# Patient Record
Sex: Male | Born: 1995 | Race: White | Hispanic: No | Marital: Single | Smoking: Never smoker
Health system: Southern US, Community
[De-identification: ages and names within clinical notes are randomized; demographics above are authoritative.]

## PROBLEM LIST (undated history)

## (undated) DIAGNOSIS — Z8249 Family history of ischemic heart disease and other diseases of the circulatory system: Secondary | ICD-10-CM

## (undated) DIAGNOSIS — E663 Overweight: Secondary | ICD-10-CM

## (undated) DIAGNOSIS — Z833 Family history of diabetes mellitus: Secondary | ICD-10-CM

## (undated) HISTORY — DX: Family history of ischemic heart disease and other diseases of the circulatory system: Z82.49

## (undated) HISTORY — DX: Overweight: E66.3

## (undated) HISTORY — DX: Family history of diabetes mellitus: Z83.3

## (undated) HISTORY — PX: TONSILLECTOMY AND ADENOIDECTOMY: SUR1326

## (undated) HISTORY — PX: TYMPANOSTOMY TUBE PLACEMENT: SHX32

---
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
## 2016-06-07 ENCOUNTER — Encounter: Payer: Self-pay | Admitting: Family Medicine

## 2016-06-07 ENCOUNTER — Ambulatory Visit (INDEPENDENT_AMBULATORY_CARE_PROVIDER_SITE_OTHER): Admitting: Family Medicine

## 2016-06-07 VITALS — BP 131/72 | HR 72 | Ht 76.0 in | Wt 221.0 lb

## 2016-06-07 DIAGNOSIS — Z833 Family history of diabetes mellitus: Secondary | ICD-10-CM

## 2016-06-07 DIAGNOSIS — Z8249 Family history of ischemic heart disease and other diseases of the circulatory system: Secondary | ICD-10-CM

## 2016-06-07 DIAGNOSIS — E663 Overweight: Secondary | ICD-10-CM | POA: Diagnosis not present

## 2016-06-07 HISTORY — DX: Family history of ischemic heart disease and other diseases of the circulatory system: Z82.49

## 2016-06-07 HISTORY — DX: Overweight: E66.3

## 2016-06-07 HISTORY — DX: Family history of diabetes mellitus: Z83.3

## 2016-06-07 NOTE — Progress Notes (Signed)
New patient office visit note:  Impression and Recommendations:    1. Family history of early CAD- father died AMI age 21   2. Overweight (BMI 25.0-29.9)   3. Family history of essential hypertension   4. Family history of diabetes mellitus (DM)    -Encourage prudent diet of Mediterranean diet -Recommend 3040 minutes of continuous moderate intensity aerobic activity more than 5 days a week. - Weight loss encouraged. -Recommend to patient that he is pre-hypertensive and normal blood pressures 119/79 or less according to the AHA -Recommend fasting yearly blood work in the future at follow-up.  Patient's Medications   No medications on file    Return in about 3 months (around 09/07/2016) for Fasting blood work in near future.  The patient was counseled, risk factors were discussed, anticipatory guidance given.  Gross side effects, risk and benefits, and alternatives of medications discussed with patient.  Patient is aware that all medications have potential side effects and we are unable to predict every side effect or drug-drug interaction that may occur.  Expresses verbal understanding and consents to current therapy plan and treatment regimen.  Please see AVS handed out to patient at the end of our visit for further patient instructions/ counseling done pertaining to today's office visit.    Note: This document was prepared using Dragon voice recognition software and may include unintentional dictation errors.  ----------------------------------------------------------------------------------------------------------------------    Subjective:    Chief Complaint  Patient presents with  . Establish Care    HPI: Cristian Kennedy is a pleasant 20 y.o. male who [facility] to review their medical history with me and establish care.   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  His father died 11/11/2011 at age 21 of a heart attack. He also had a stroke in his late 9740s. He was a drinker and smoker.  Patient not sexually active currently. Has had male partners in past but not now.  Never smoked, doesn't drink.  Patient reports satisfied with his weight and has a fair diet.     Wt Readings from Last 3 Encounters:  06/07/16 221 lb (100.2 kg)   BP Readings from Last 3 Encounters:  06/07/16 131/72   Pulse Readings from Last 3 Encounters:  06/07/16 72     Patient Active Problem List   Diagnosis Date Noted  . Family history of early CAD- father died AMI age 21 06/07/2016  . Overweight (BMI 25.0-29.9) 06/07/2016  . Family history of essential hypertension 06/07/2016  . Family history of diabetes mellitus (DM) 06/07/2016     Past Medical History:  Diagnosis Date  . Family history of diabetes mellitus (DM) 06/07/2016  . Family history of early CAD- father died AMI age 10250 06/07/2016  . Family history of essential hypertension 06/07/2016  . Overweight (BMI 25.0-29.9) 06/07/2016     Past Surgical History:  Procedure Laterality Date  . TONSILLECTOMY AND ADENOIDECTOMY    . TYMPANOSTOMY TUBE PLACEMENT       Family History  Problem Relation Age of Onset  . Cancer Mother     leukemia  . Hypertension Mother   . Alcohol abuse Father   .  Stroke Father   . Alcohol abuse Paternal Uncle   . Diabetes Maternal Grandmother   . Hypertension Maternal Grandmother   . Cancer Paternal Grandmother     stomach/liver  . Diabetes Paternal Grandmother   . Hypertension Paternal Grandmother   . Alcohol abuse Paternal Grandfather      History  Drug Use No    History  Alcohol Use No    History  Smoking Status  . Never Smoker  Smokeless Tobacco  . Never Used     Patient's Medications   No medications on file    Allergies: Review of patient's allergies indicates no known allergies.  Review of Systems:   ( Completed via Adult Medical History Intake form today ) General:  Denies fever, chills, appetite changes, unexplained weight loss.  Optho/Auditory:   Denies visual changes, blurred vision/LOV, ringing in ears/ diff hearing Respiratory:   Denies SOB, DOE, cough, wheezing.  Cardiovascular:   Denies chest pain, palpitations, new onset peripheral edema  Gastrointestinal:   Denies nausea, vomiting, diarrhea.  Genitourinary:    Denies dysuria, increased frequency, flank pain.  Endocrine:     Denies hot or cold intolerance, polyuria, polydipsia. Musculoskeletal:  Denies unexplained myalgias, joint swelling, arthralgias, gait problems.  Skin:  Denies rash, suspicious lesions or new/ changes in moles Neurological:    Denies dizziness, syncope, unexplained weakness, lightheadedness, numbness  Psychiatric/Behavioral:   Denies mood changes, suicidal or homicidal ideations, hallucinations    Objective:   There were no vitals taken for this visit. Body mass index is 26.9 kg/m.   General: Well Developed, well nourished, and in no acute distress.  Neuro: Alert and oriented x3, extra-ocular muscles intact, sensation grossly intact.  HEENT: Normocephalic, atraumatic, pupils equal round reactive to light, neck supple, no gross masses, no carotid bruits, no JVD apprec Skin: no gross suspicious lesions or rashes  Cardiac: Regular rate and rhythm, no murmurs rubs or gallops.  Respiratory: Essentially clear to auscultation bilaterally. Not using accessory muscles, speaking in full sentences.  Abdominal: Soft, not grossly distended Musculoskeletal: Ambulates w/o diff, FROM * 4 ext.  Vasc: less 2 sec cap RF, warm and pink  Psych:  No HI/SI, judgement and insight good.

## 2016-06-07 NOTE — Patient Instructions (Signed)
     Mediterranean Diet  Why follow it? Research shows. . Those who follow the Mediterranean diet have a reduced risk of heart disease  . The diet is associated with a reduced incidence of Parkinson's and Alzheimer's diseases . People following the diet may have longer life expectancies and lower rates of chronic diseases  . The Dietary Guidelines for Americans recommends the Mediterranean diet as an eating plan to promote health and prevent disease  What Is the Mediterranean Diet?  . Healthy eating plan based on typical foods and recipes of Mediterranean-style cooking . The diet is primarily a plant based diet; these foods should make up a majority of meals   Starches - Plant based foods should make up a majority of meals - They are an important sources of vitamins, minerals, energy, antioxidants, and fiber - Choose whole grains, foods high in fiber and minimally processed items  - Typical grain sources include wheat, oats, barley, corn, brown rice, bulgar, farro, millet, polenta, couscous  - Various types of beans include chickpeas, lentils, fava beans, black beans, white beans   Fruits  Veggies - Large quantities of antioxidant rich fruits & veggies; 6 or more servings  - Vegetables can be eaten raw or lightly drizzled with oil and cooked  - Vegetables common to the traditional Mediterranean Diet include: artichokes, arugula, beets, broccoli, brussel sprouts, cabbage, carrots, celery, collard greens, cucumbers, eggplant, kale, leeks, lemons, lettuce, mushrooms, okra, onions, peas, peppers, potatoes, pumpkin, radishes, rutabaga, shallots, spinach, sweet potatoes, turnips, zucchini - Fruits common to the Mediterranean Diet include: apples, apricots, avocados, cherries, clementines, dates, figs, grapefruits, grapes, melons, nectarines, oranges, peaches, pears, pomegranates, strawberries, tangerines  Fats - Replace butter and margarine with healthy oils, such as olive oil, canola oil, and  tahini  - Limit nuts to no more than a handful a day  - Nuts include walnuts, almonds, pecans, pistachios, pine nuts  - Limit or avoid candied, honey roasted or heavily salted nuts - Olives are central to the Mediterranean diet - can be eaten whole or used in a variety of dishes   Meats Protein - Limiting red meat: no more than a few times a month - When eating red meat: choose lean cuts and keep the portion to the size of deck of cards - Eggs: approx. 0 to 4 times a week  - Fish and lean poultry: at least 2 a week  - Healthy protein sources include, chicken, turkey, lean beef, lamb - Increase intake of seafood such as tuna, salmon, trout, mackerel, shrimp, scallops - Avoid or limit high fat processed meats such as sausage and bacon  Dairy - Include moderate amounts of low fat dairy products  - Focus on healthy dairy such as fat free yogurt, skim milk, low or reduced fat cheese - Limit dairy products higher in fat such as whole or 2% milk, cheese, ice cream  Alcohol - Moderate amounts of red wine is ok  - No more than 5 oz daily for women (all ages) and men older than age 65  - No more than 10 oz of wine daily for men younger than 65  Other - Limit sweets and other desserts  - Use herbs and spices instead of salt to flavor foods  - Herbs and spices common to the traditional Mediterranean Diet include: basil, bay leaves, chives, cloves, cumin, fennel, garlic, lavender, marjoram, mint, oregano, parsley, pepper, rosemary, sage, savory, sumac, tarragon, thyme   It's not just a diet, it's   a lifestyle:  . The Mediterranean diet includes lifestyle factors typical of those in the region  . Foods, drinks and meals are best eaten with others and savored . Daily physical activity is important for overall good health . This could be strenuous exercise like running and aerobics . This could also be more leisurely activities such as walking, housework, yard-work, or taking the stairs . Moderation is  the key; a balanced and healthy diet accommodates most foods and drinks . Consider portion sizes and frequency of consumption of certain foods   Meal Ideas & Options:  . Breakfast:  o Whole wheat toast or whole wheat English muffins with peanut butter & hard boiled egg o Steel cut oats topped with apples & cinnamon and skim milk  o Fresh fruit: banana, strawberries, melon, berries, peaches  o Smoothies: strawberries, bananas, greek yogurt, peanut butter o Low fat greek yogurt with blueberries and granola  o Egg white omelet with spinach and mushrooms o Breakfast couscous: whole wheat couscous, apricots, skim milk, cranberries  . Sandwiches:  o Hummus and grilled vegetables (peppers, zucchini, squash) on whole wheat bread   o Grilled chicken on whole wheat pita with lettuce, tomatoes, cucumbers or tzatziki  o Tuna salad on whole wheat bread: tuna salad made with greek yogurt, olives, red peppers, capers, green onions o Garlic rosemary lamb pita: lamb sauted with garlic, rosemary, salt & pepper; add lettuce, cucumber, greek yogurt to pita - flavor with lemon juice and black pepper  . Seafood:  o Mediterranean grilled salmon, seasoned with garlic, basil, parsley, lemon juice and black pepper o Shrimp, lemon, and spinach whole-grain pasta salad made with low fat greek yogurt  o Seared scallops with lemon orzo  o Seared tuna steaks seasoned salt, pepper, coriander topped with tomato mixture of olives, tomatoes, olive oil, minced garlic, parsley, green onions and cappers  . Meats:  o Herbed greek chicken salad with kalamata olives, cucumber, feta  o Red bell peppers stuffed with spinach, bulgur, lean ground beef (or lentils) & topped with feta   o Kebabs: skewers of chicken, tomatoes, onions, zucchini, squash  o Turkey burgers: made with red onions, mint, dill, lemon juice, feta cheese topped with roasted red peppers . Vegetarian o Cucumber salad: cucumbers, artichoke hearts, celery, red  onion, feta cheese, tossed in olive oil & lemon juice  o Hummus and whole grain pita points with a greek salad (lettuce, tomato, feta, olives, cucumbers, red onion) o Lentil soup with celery, carrots made with vegetable broth, garlic, salt and pepper  o Tabouli salad: parsley, bulgur, mint, scallions, cucumbers, tomato, radishes, lemon juice, olive oil, salt and pepper.  

## 2016-06-09 ENCOUNTER — Other Ambulatory Visit (INDEPENDENT_AMBULATORY_CARE_PROVIDER_SITE_OTHER)

## 2016-06-09 DIAGNOSIS — E663 Overweight: Secondary | ICD-10-CM

## 2016-06-09 DIAGNOSIS — Z8249 Family history of ischemic heart disease and other diseases of the circulatory system: Secondary | ICD-10-CM | POA: Diagnosis not present

## 2016-06-09 DIAGNOSIS — Z833 Family history of diabetes mellitus: Secondary | ICD-10-CM

## 2016-06-09 LAB — POCT GLYCOSYLATED HEMOGLOBIN (HGB A1C): Hemoglobin A1C: >14

## 2016-06-10 LAB — COMPREHENSIVE METABOLIC PANEL
ALK PHOS: 112 U/L (ref 40–115)
ALT: 23 U/L (ref 9–46)
AST: 20 U/L (ref 10–40)
Albumin: 4.2 g/dL (ref 3.6–5.1)
BUN: 13 mg/dL (ref 7–25)
CALCIUM: 9.7 mg/dL (ref 8.6–10.3)
CHLORIDE: 97 mmol/L — AB (ref 98–110)
CO2: 29 mmol/L (ref 20–31)
Creat: 1.06 mg/dL (ref 0.60–1.35)
GLUCOSE: 385 mg/dL — AB (ref 65–99)
POTASSIUM: 4.9 mmol/L (ref 3.5–5.3)
Sodium: 138 mmol/L (ref 135–146)
Total Bilirubin: 0.6 mg/dL (ref 0.2–1.2)
Total Protein: 7.4 g/dL (ref 6.1–8.1)

## 2016-06-10 LAB — CBC WITH DIFFERENTIAL/PLATELET
BASOS ABS: 0 {cells}/uL (ref 0–200)
BASOS PCT: 0 %
EOS ABS: 112 {cells}/uL (ref 15–500)
Eosinophils Relative: 2 %
HEMATOCRIT: 46.8 % (ref 38.5–50.0)
Hemoglobin: 16 g/dL (ref 13.2–17.1)
LYMPHS PCT: 44 %
Lymphs Abs: 2464 cells/uL (ref 850–3900)
MCH: 28.9 pg (ref 27.0–33.0)
MCHC: 34.2 g/dL (ref 32.0–36.0)
MCV: 84.5 fL (ref 80.0–100.0)
MONO ABS: 392 {cells}/uL (ref 200–950)
MONOS PCT: 7 %
MPV: 12.4 fL (ref 7.5–12.5)
NEUTROS PCT: 47 %
Neutro Abs: 2632 cells/uL (ref 1500–7800)
PLATELETS: 204 10*3/uL (ref 140–400)
RBC: 5.54 MIL/uL (ref 4.20–5.80)
RDW: 12.8 % (ref 11.0–15.0)
WBC: 5.6 10*3/uL (ref 3.8–10.8)

## 2016-06-10 LAB — LIPID PANEL
CHOL/HDL RATIO: 5.6 ratio — AB (ref <=5.0)
CHOLESTEROL: 229 mg/dL — AB (ref 125–200)
HDL: 41 mg/dL (ref >=40)
Triglycerides: 543 mg/dL — ABNORMAL HIGH (ref <150)

## 2016-06-10 LAB — TSH: TSH: 8.29 mIU/L — ABNORMAL HIGH (ref 0.40–4.50)

## 2016-06-10 LAB — VITAMIN D 25 HYDROXY (VIT D DEFICIENCY, FRACTURES): Vit D, 25-Hydroxy: 21 ng/mL — ABNORMAL LOW (ref 30–100)

## 2016-06-12 NOTE — Progress Notes (Signed)
  I will discuss the results of these tests with the patient at our upcoming planned follow-up office visit.    (However Cristian Kennedy, if patient is a no-show, please send them a letter with these results and ask pt to please contact our office for a follow-up office visit so we can discuss them and determine what further action is needed.  Thank you!)

## 2016-06-13 ENCOUNTER — Ambulatory Visit (INDEPENDENT_AMBULATORY_CARE_PROVIDER_SITE_OTHER): Admitting: Family Medicine

## 2016-06-13 ENCOUNTER — Encounter: Payer: Self-pay | Admitting: Family Medicine

## 2016-06-13 VITALS — BP 108/67 | HR 64 | Wt 225.0 lb

## 2016-06-13 DIAGNOSIS — Z8249 Family history of ischemic heart disease and other diseases of the circulatory system: Secondary | ICD-10-CM

## 2016-06-13 DIAGNOSIS — E1010 Type 1 diabetes mellitus with ketoacidosis without coma: Secondary | ICD-10-CM | POA: Diagnosis not present

## 2016-06-13 DIAGNOSIS — E559 Vitamin D deficiency, unspecified: Secondary | ICD-10-CM | POA: Diagnosis not present

## 2016-06-13 DIAGNOSIS — E781 Pure hyperglyceridemia: Secondary | ICD-10-CM

## 2016-06-13 DIAGNOSIS — E1069 Type 1 diabetes mellitus with other specified complication: Secondary | ICD-10-CM | POA: Insufficient documentation

## 2016-06-13 DIAGNOSIS — E663 Overweight: Secondary | ICD-10-CM

## 2016-06-13 DIAGNOSIS — R946 Abnormal results of thyroid function studies: Secondary | ICD-10-CM | POA: Diagnosis not present

## 2016-06-13 DIAGNOSIS — E109 Type 1 diabetes mellitus without complications: Secondary | ICD-10-CM | POA: Insufficient documentation

## 2016-06-13 DIAGNOSIS — R7989 Other specified abnormal findings of blood chemistry: Secondary | ICD-10-CM

## 2016-06-13 MED ORDER — NIACIN ER (ANTIHYPERLIPIDEMIC) 1000 MG PO TBCR: 1000.0000 mg | EXTENDED_RELEASE_TABLET | Freq: Every day | ORAL | 1 refills | Status: DC

## 2016-06-13 MED ORDER — INSULIN GLARGINE 100 UNIT/ML SOLOSTAR PEN: 20.0000 [IU] | PEN_INJECTOR | Freq: Every day | SUBCUTANEOUS | 11 refills | Status: DC

## 2016-06-13 NOTE — Progress Notes (Signed)
Assessment and plan:  1. Uncontrolled type 1 diabetes mellitus with ketoacidosis without coma ([place])   2. Hypertriglyceridemia, familial   3. Elevated TSH   4. Vitamin D insufficiency   5. Family history of early CAD- father died AMI age 21   6. Overweight (BMI 25.0-29.9)     Vitamin D insufficiency Supplementation discussed with patient of 5000 IUs D3 daily including a multivitamin.  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  - Extensive counseling on diabetes type 1 done today--> Patient is in absolute shock today by this news and is tearful/ devastated. Pt was in the office today for 40+ minutes, with over 50% time spent in face to face counseling of various medical concerns and in coordination of care  - Blood glucose monitoring discussed with patient extensively and handouts provided  - Lantus at low dose starting at night- counseling done.   Told patient to please ask pharmacist to show him exactly how to use the prefilled syringes.   - I discussed he needs to sign up for my chart so that we can have close contact- and he can ask me any questions he may have until he gets in with the endocrinologist down in [place].   Referral placed today.  -  patient to follow-up with me within a couple days, since he will be going to [place] to go back to college in just 2-3 days  Hypertriglyceridemia, familial - Extensive counseling done. We will start B vitamin on patient today so that he doesn't feel so overwhelmed with all these different medications.  - Low saturated and  Transfats diet discussed.   - Personal handwritten educational notes on his lab results sheets provided to patient to include AHA exercise guidelines and dietary modifications for hypertriglyceridemia and DM.      Family history of early CAD- father died AMI age 98 Patient does not know if he had diabetes are not as his father really never went to the doctors.  Overweight (BMI 25.0-29.9) Exercising and dietary counseling done.  Elevated TSH Discussed with patient the clinical significance of this.   I explained I would like to obtain further blood work from him yet we both determined he will follow-up with endocrinologist in [place] in regards to this since patient is completely asymptomatic and feeling overwhelmed as is with having to go on treatment for diabetes.   Patient's Medications  New Prescriptions   INSULIN GLARGINE (LANTUS SOLOSTAR) 100 UNIT/ML SOLOSTAR PEN    Inject 20 Units into the skin at bedtime. Please include QS 3 months.   NIACIN (NIASPAN) 1000 MG CR TABLET    Take 1 tablet (1,000 mg total) by mouth at bedtime.  Previous Medications   No medications on file  Modified Medications   No medications on file  Discontinued Medications   No medications on file    Return in about 3 days (around 06/16/2016) for Follow-up of current medical issues.  Anticipatory guidance and routine counseling done re: condition, txmnt options and need for follow up. All questions of patient's were answered.   Gross side effects, risk and benefits, and  alternatives of medications discussed with patient.  Patient is aware that all medications have potential side effects and we are unable to predict every sideeffect or drug-drug interaction that may occur.  Expresses verbal understanding and consents to current therapy plan and treatment regiment.  Please see AVS handed out to patient at the end of our visit for additional patient instructions/ counseling done pertaining to today's office  visit.  Note: This document was prepared using Dragon voice recognition software and may include unintentional dictation errors.   ----------------------------------------------------------------------------------------------------------------------  Subjective:   CC: Cristian Kennedy is a 20 y.o. male who presents to [place] at [facility] today for review of his recent fasting labs which are consistent with:  off the chart type 1 diabetes, extreme hyper triglyceride levels and elevated TSH, all which suggest autoimmune disorder   A lot DM in his family - about 75% of those in his family are diabetic. Patient doesn't know many type ones though.   Patient is completely asymptomatic today, and is incomplete shock of this news as am I - He is very tearful, frightened and overwhelmed.    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       Drinks a lot of soda and gatorade. Ever since middle school - he recalls always being thirsty but yet he "always drink a lot".   He denies any    Wt Readings from Last 3 Encounters:  06/16/16 222 lb 1.6 oz (100.7 kg)  06/13/16 225 lb (102.1 kg)  06/15/2016 221 lb (100.2 kg)   BP Readings from Last 3 Encounters:  06/16/16 115/75  06/13/16 108/67  06-15-16 131/72   Pulse Readings from Last 3 Encounters:  06/16/16 68  06/13/16 64  06/15/16 72      Full medical history updated and reviewed in the office today  Patient Active Problem List   Diagnosis Date Noted  . Type 1 diabetes mellitus with ketoacidosis, uncontrolled ([place]) 06/13/2016    Priority: High  . Hypertriglyceridemia, familial 06/13/2016    Priority: High  . Family history of early CAD- father died AMI age 27 06/15/16    Priority: High  . Elevated TSH 06/25/2016    Priority: Medium  . Overweight (BMI 25.0-29.9)  06/15/2016    Priority: Medium  . Vitamin D insufficiency 06/25/2016    Priority: Low  . Family history of essential hypertension 06/15/2016  . Family history of diabetes mellitus (DM) 06/15/2016    Past Medical History:  Diagnosis Date  . Family history of diabetes mellitus (DM) 15-Jun-2016  . Family history of early CAD- father died AMI age 41 2016/06/15  . Family history of essential hypertension 06-15-2016  . Overweight (BMI 25.0-29.9) Jun 15, 2016    Past Surgical History:  Procedure Laterality Date  . TONSILLECTOMY AND ADENOIDECTOMY    . TYMPANOSTOMY TUBE PLACEMENT      Social History  Substance Use Topics  . Smoking status: Never Smoker  . Smokeless tobacco: Never Used  . Alcohol use No    family history includes Alcohol abuse in his father, paternal grandfather, and paternal uncle; Cancer in his mother and paternal grandmother; Diabetes in his maternal grandmother and paternal grandmother; Hypertension in his maternal grandmother, mother, and paternal grandmother; Stroke in his father.   Medications: Current Outpatient Prescriptions  Medication Sig Dispense Refill  .  Insulin Glargine (LANTUS SOLOSTAR) 100 UNIT/ML Solostar Pen Inject 20 Units into the skin at bedtime. Please include QS 3 months. (Patient not taking: Reported on 06/16/2016) 1 pen 11  . niacin (NIASPAN) 1000 MG CR tablet Take 1 tablet (1,000 mg total) by mouth at bedtime. (Patient not taking: Reported on 06/16/2016) 90 tablet 1   No current facility-administered medications for this visit.     Allergies:  No Known Allergies   ROS:  Const:    no fevers, chills Eyes:    conjunctiva clear, no vision changes or blurred vision ENT:  no hearing difficulties, no dysphagia, no dysphonia, no nose bleeds CV:   no chest pain, arrhythmias, no orthopnea, no PND Pulm:   no SOB at rest or exertion, no Wheeze, no DIB, no hemoptysis GI:    no N/V/D/C, no abd pain GU:   no blood in urine or inc freq or urgency Heme/Onc:     no unexplained bleeding, no night sweats, no more fatigue than usual Neuro:   No dizziness, no LOC, No unexplained weakness or numbness Endo:   no unexplained wt loss or gain M-Sk:   no localized myalgias or arthralgias Psych:    No SI/HI, no memory prob or unexplained confusion    Objective:  Blood pressure 108/67, pulse 64, weight 225 lb (102.1 kg), SpO2 100 %. Body mass index is 27.39 kg/m.  Gen: Well NAD, A and O *3 HEENT: [place]/AT, EOMI,  MMM, OP- clr Lungs: Normal work of breathing. CTA B/L, no Wh, rhonchi Heart: RRR, S1, S2 WNL's, no MRG Abd: Soft. No gross distention Exts: warm, pink,  Brisk capillary refill, warm and well perfused.      Recent Results (from the past 2160 hour(s))  Lipid panel     Status: Abnormal   Collection Time: 06/09/16  8:46 AM  Result Value Ref Range   Cholesterol 229 (H) 125 - 200 mg/dL   Triglycerides 543 (H) <150 mg/dL   HDL 41 >=40 mg/dL   Total CHOL/HDL Ratio 5.6 (H) <=5.0 Ratio   VLDL NOT CALC <30 mg/dL    Comment:   Not calculated due to Triglyceride >400. Suggest ordering Direct LDL (Unit Code: [id number]).    LDL Cholesterol NOT CALC <130 mg/dL    Comment:   Not calculated due to Triglyceride >400. Suggest ordering Direct LDL (Unit Code: [phone]).   Total Cholesterol/HDL Ratio:CHD Risk                        Coronary Heart Disease Risk Table                                        Men       Women          1/2 Average Risk              3.4        3.3              Average Risk              5.0        4.4           2X Average Risk              9.6        7.1  3X Average Risk             23.4       11.0 Use the calculated Patient Ratio above and the CHD Risk table  to determine the patient's CHD Risk.   CBC with Differential/Platelet     Status: None   Collection Time: 06/09/16  8:46 AM  Result Value Ref Range   WBC 5.6 3.8 - 10.8 K/uL   RBC 5.54 4.20 - 5.80 MIL/uL   Hemoglobin 16.0 13.2 - 17.1 g/dL   HCT 46.8 38.5 - 50.0 %     MCV 84.5 80.0 - 100.0 fL   MCH 28.9 27.0 - 33.0 pg   MCHC 34.2 32.0 - 36.0 g/dL   RDW 12.8 11.0 - 15.0 %   Platelets 204 140 - 400 K/uL   MPV 12.4 7.5 - 12.5 fL   Neutro Abs 2,632 1,500 - 7,800 cells/uL   Lymphs Abs 2,464 850 - 3,900 cells/uL   Monocytes Absolute 392 200 - 950 cells/uL   Eosinophils Absolute 112 15 - 500 cells/uL   Basophils Absolute 0 0 - 200 cells/uL   Neutrophils Relative % 47 %   Lymphocytes Relative 44 %   Monocytes Relative 7 %   Eosinophils Relative 2 %   Basophils Relative 0 %   Smear Review Criteria for review not met     Comment: ** Please note change in unit of measure and reference range(s). **  Comprehensive metabolic panel     Status: Abnormal   Collection Time: 06/09/16  8:46 AM  Result Value Ref Range   Sodium 138 135 - 146 mmol/L   Potassium 4.9 3.5 - 5.3 mmol/L   Chloride 97 (L) 98 - 110 mmol/L   CO2 29 20 - 31 mmol/L   Glucose, Bld 385 (H) 65 - 99 mg/dL   BUN 13 7 - 25 mg/dL   Creat 1.06 0.60 - 1.35 mg/dL   Total Bilirubin 0.6 0.2 - 1.2 mg/dL   Alkaline Phosphatase 112 40 - 115 U/L   AST 20 10 - 40 U/L   ALT 23 9 - 46 U/L   Total Protein 7.4 6.1 - 8.1 g/dL   Albumin 4.2 3.6 - 5.1 g/dL   Calcium 9.7 8.6 - 10.3 mg/dL  VITAMIN D 25 Hydroxy (Vit-D Deficiency, Fractures)     Status: Abnormal   Collection Time: 06/09/16  8:46 AM  Result Value Ref Range   Vit D, 25-Hydroxy 21 (L) 30 - 100 ng/mL    Comment: Vitamin D Status           25-OH Vitamin D        Deficiency                <20 ng/mL        Insufficiency         20 - 29 ng/mL        Optimal             > or = 30 ng/mL   For 25-OH Vitamin D testing on patients on D2-supplementation and patients for whom quantitation of D2 and D3 fractions is required, the [facility] 25-OH VIT D, (D2,D3), LC/MS/MS is recommended: order code [phone] (patients > 2 yrs).   TSH     Status: Abnormal   Collection Time: 06/09/16  8:46 AM  Result Value Ref Range   TSH 8.29 (H) 0.40 - 4.50 mIU/L  POCT  glycosylated hemoglobin (Hb A1C)     Status:  Abnormal   Collection Time: 06/09/16  9:13 AM  Result Value Ref Range   Hemoglobin A1C >14.0

## 2016-06-13 NOTE — Patient Instructions (Addendum)
Check with insurance and see which glucometer they recommend.  Then call Tonya and let her know so we can call in one for you.   [email or web address]     Blood Glucose Monitoring, Adult Monitoring your blood glucose (also know as blood sugar) helps you to manage your diabetes. It also helps you and your health care provider monitor your diabetes and determine how well your treatment plan is working. WHY SHOULD YOU MONITOR YOUR BLOOD GLUCOSE?  It can help you understand how food, exercise, and medicine affect your blood glucose.  It allows you to know what your blood glucose is at any given moment. You can quickly tell if you are having low blood glucose (hypoglycemia) or high blood glucose (hyperglycemia).  It can help you and your health care provider know how to adjust your medicines.  It can help you understand how to manage an illness or adjust medicine for exercise. WHEN SHOULD YOU TEST? Your health care provider will help you decide how often you should check your blood glucose. This may depend on the type of diabetes you have, your diabetes control, or the types of medicines you are taking. Be sure to write down all of your blood glucose readings so that this information can be reviewed with your health care provider. See below for examples of testing times that your health care provider may suggest. Type 1 Diabetes  Test at least 2 times per day if your diabetes is well controlled, if you are using an insulin pump, or if you perform multiple daily injections.  If your diabetes is not well controlled or if you are sick, you may need to test more often.  It is a good idea to also test:  Before every insulin injection.  Before and after exercise.  Between meals and 2 hours after a meal.  Occasionally between 2:00 a.m. and 3:00 a.m. Type 2 Diabetes  If you are taking insulin, test at least 2 times per day. However, it is best to test before every insulin injection.  If you take  medicines by mouth (orally), test 2 times a day.  If you are on a controlled diet, test once a day.  If your diabetes is not well controlled or if you are sick, you may need to monitor more often. HOW TO MONITOR YOUR BLOOD GLUCOSE Supplies Needed  Blood glucose meter.  Test strips for your meter. Each meter has its own strips. You must use the strips that go with your own meter.  A pricking needle (lancet).  A device that holds the lancet (lancing device).  A journal or log book to write down your results. Procedure  Wash your hands with soap and water. Alcohol is not preferred.  Prick the side of your finger (not the tip) with the lancet.  Gently milk the finger until a small drop of blood appears.  Follow the instructions that come with your meter for inserting the test strip, applying blood to the strip, and using your blood glucose meter. Other Areas to Get Blood for Testing Some meters allow you to use other areas of your body (other than your finger) to test your blood. These areas are called alternative sites. The most common alternative sites are:  The forearm.  The thigh.  The back area of the lower leg.  The palm of the hand. The blood flow in these areas is slower. Therefore, the blood glucose values you get may be delayed, and the numbers are different from  what you would get from your fingers. Do not use alternative sites if you think you are having hypoglycemia. Your reading will not be accurate. Always use a finger if you are having hypoglycemia. Also, if you cannot feel your lows (hypoglycemia unawareness), always use your fingers for your blood glucose checks. ADDITIONAL TIPS FOR GLUCOSE MONITORING  Do not reuse lancets.  Always carry your supplies with you.  All blood glucose meters have a 24-hour "hotline" number to call if you have questions or need help.  Adjust (calibrate) your blood glucose meter with a control solution after finishing a few boxes  of strips. BLOOD GLUCOSE RECORD KEEPING It is a good idea to keep a daily record or log of your blood glucose readings. Most glucose meters, if not all, keep your glucose records stored in the meter. Some meters come with the ability to download your records to your home computer. Keeping a record of your blood glucose readings is especially helpful if you are wanting to look for patterns. Make notes to go along with the blood glucose readings because you might forget what happened at that exact time. Keeping good records helps you and your health care provider to work together to achieve good diabetes management.    This information is not intended to replace advice given to you by your health care provider. Make sure you discuss any questions you have with your health care provider.   Document Released: 10/19/2003 Document Revised: 11/06/2014 Document Reviewed: 03/10/2013 Elsevier Interactive Patient Education 2016 [employer]     Type 1 Diabetes Mellitus, Adult Type 1 diabetes mellitus, often simply referred to as diabetes, is a long-term (chronic) disease. It occurs when the islet cells in the pancreas that make insulin (a hormone) are destroyed and can no longer make insulin. Insulin is needed to move sugars from food into the tissue cells. The tissue cells use the sugars for energy. In people with type 1 diabetes, the sugars build up in the blood instead of going into the tissue cells. As a result, high blood sugar (hyperglycemia) develops. Without insulin, the body breaks down fat cells for the needed energy. This breakdown of fat cells produces acid chemicals (ketones), which increases the acid levels in the body. The effect of either high ketone or high sugar (glucose) levels can be life-threatening.  Type 1 diabetes was also previously called juvenile diabetes. It most often occurs before the age of 21, but it can occur at any age. RISK FACTORS A person is predisposed to developing type  1 diabetes if someone in his or her family has the disease and is exposed to certain additional environmental triggers.  SYMPTOMS  Symptoms of type 1 diabetes may develop gradually over days to weeks or suddenly. The symptoms occur due to hyperglycemia. The symptoms can include:   Increased thirst (polydipsia).  Increased urination (polyuria).  Increased urination during the night (nocturia).  Weight loss. This weight loss may be rapid.  Frequent, recurring infections.  Tiredness (fatigue).  Weakness.  Vision changes, such as blurred vision.  Fruity smell to your breath.  Abdominal pain.  Nausea or vomiting.  An open skin wound (ulcer). DIAGNOSIS  Type 1 diabetes is diagnosed when symptoms of diabetes are present and when blood glucose levels are increased. Your blood glucose level may be checked by one or more of the following blood tests:  A fasting blood glucose test. You will not be allowed to eat for at least 8 hours before a blood  sample is taken.  A random blood glucose test. Your blood glucose is checked at any time of the day regardless of when you ate.  A hemoglobin A1c blood glucose test. A hemoglobin A1c test provides information about blood glucose control over the previous 3 months. TREATMENT  Although type 1 diabetes cannot be prevented, it can be managed with insulin, diet, and exercise.  You will need to take insulin daily to keep blood glucose in the desired range.  You will need to match insulin dosing with exercise and healthy food choices. Generally, the goal of treatment is to maintain a pre-meal (preprandial) blood glucose level of 80-130 mg/dL. HOME CARE INSTRUCTIONS   Have your hemoglobin A1c level checked twice a year.  Perform daily blood glucose monitoring as directed by your health care provider.  Monitor urine ketones when you are ill and as directed by your health care provider.  Take your insulin as directed by your health care  provider to maintain your blood glucose level in the desired range.  Never run out of insulin. It is needed every day.  Adjust insulin based on your intake of carbohydrates. Carbohydrates can raise blood glucose levels but need to be included in your diet. Carbohydrates provide vitamins, minerals, and fiber, which are an essential part of a healthy diet. Carbohydrates are found in fruits, vegetables, whole grains, dairy products, legumes, and foods containing added sugars.  Eat healthy foods. Alternate 3 meals with 3 snacks.  Maintain a healthy weight.  Carry a medical alert card or wear your medical alert jewelry.  Carry a 15-gram carbohydrate snack with you at all times to treat low blood glucose (hypoglycemia). Some examples of 15-gram carbohydrate snacks include:  Glucose tablets, 3 or 4.  Glucose gel, 15-gram tube.  Raisins, 2 tablespoons (24 grams).  Jelly beans, 6.  Animal crackers, 8.  Fruit juice, regular soda, or low-fat milk, 4 ounces (120 mL).  Gummy treats, 9.  Recognize hypoglycemia. Hypoglycemia occurs with blood glucose levels of 70 mg/dL and below. The risk for hypoglycemia increases when fasting or skipping meals, during or after intense exercise, and during sleep. Hypoglycemia symptoms can include:  Tremors or shakes.  Decreased ability to concentrate.  Sweating.  Increased heart rate.  Headache.  Dry mouth.  Hunger.  Irritability.  Anxiety.  Restless sleep.  Altered speech or coordination.  Confusion.  Treat hypoglycemia promptly. If you are alert and able to safely swallow, follow the 15:15 rule:  Take 15-20 grams of rapid-acting glucose or carbohydrate. Rapid-acting options include glucose gel, glucose tablets, or 4 ounces (120 mL) of fruit juice, regular soda, or low-fat milk.  Check your blood glucose level 15 minutes after taking the glucose.  Take 15-20 grams more of glucose if the repeat blood glucose level is still 70 mg/dL or  below.  Eat a meal or snack within 1 hour once blood glucose levels return to normal.  Be alert to polyuria and polydipsia, which are early signs of hyperglycemia. An early awareness of hyperglycemia allows for prompt treatment. Treat hyperglycemia as directed by your health care provider.  Exercise regularly as directed by your health care provider. This includes:  Stretching and performing strength training exercises, such as yoga or weight lifting, at least 2 times per week.  Performing a total of at least 150 minutes of moderate-intensity exercise each week, such as brisk walking or water aerobics.  Exercising at least 3 days per week, making sure you allow no more than 2 consecutive  days to pass without exercising.  Avoiding long periods of inactivity (90 minutes or more). When you have to spend an extended period of time sitting down, take frequent breaks to walk or stretch.  Adjust your insulin dosing and food intake as needed if you start a new exercise or sport.  Follow your sick-day plan at any time you are unable to eat or drink as usual.   Do not use any tobacco products including cigarettes, chewing tobacco, or electronic cigarettes. If you need help quitting, ask your health care provider.  Limit alcohol intake to no more than 1 drink per day for nonpregnant women and 2 drinks per day for men. You should drink alcohol only when you are also eating food. Talk with your health care provider about whether alcohol is safe for you. Tell your health care provider if you drink alcohol several times a week.  Keep all follow-up visits as directed by your health care provider.  Schedule an eye exam within 5 years of diagnosis and then annually.  Perform daily skin and foot care. Examine your skin and feet daily for cuts, bruises, redness, nail problems, bleeding, blisters, or sores. A foot exam should be done by a health care provider 5 years after diagnosis, and then every year  after the first exam.  Brush your teeth and gums at least twice a day and floss at least once a day. Follow up with your dentist regularly.  Share your diabetes management plan with your workplace or school.  Keep your immunizations up to date. It is recommended that you receive a flu (influenza) vaccine every year. It is also recommended that you receive a pneumonia (pneumococcal) vaccine. If you are 81 years of age or older and have never received a pneumonia vaccine, this vaccine may be given as a series of two separate shots. Ask your health care provider which additional vaccines may be recommended.  Learn to manage stress.  Obtain ongoing diabetes education and support as needed.  Participate in or seek rehabilitation as needed to maintain or improve independence and quality of life. Request a physical or occupational therapy referral if you are having foot or hand numbness, or difficulties with grooming, dressing, eating, or physical activity. SEEK MEDICAL CARE IF:   You are unable to eat food or drink fluids for more than 6 hours.  You have nausea and vomiting for more than 6 hours.  Your blood glucose level is over 240 mg/dL.  There is a change in mental status.  You develop an additional serious illness.  You have diarrhea for more than 6 hours.  You have been sick or have had a fever for a couple of days and are not getting better.  You have pain during any physical activity. SEEK IMMEDIATE MEDICAL CARE IF:  You have difficulty breathing.  You have moderate to large ketone levels. MAKE SURE YOU:  Understand these instructions.  Will watch your condition.  Will get help right away if you are not doing well or get worse.   This information is not intended to replace advice given to you by your health care provider. Make sure you discuss any questions you have with your health care provider.   Document Released: 10/13/2000 Document Revised: 07/07/2015 Document  Reviewed: 05/14/2012 [employer] Interactive Patient Education [employer]

## 2016-06-16 ENCOUNTER — Encounter: Payer: Self-pay | Admitting: Family Medicine

## 2016-06-16 ENCOUNTER — Ambulatory Visit (INDEPENDENT_AMBULATORY_CARE_PROVIDER_SITE_OTHER): Admitting: Family Medicine

## 2016-06-16 VITALS — BP 115/75 | HR 68 | Wt 222.1 lb

## 2016-06-16 DIAGNOSIS — E1010 Type 1 diabetes mellitus with ketoacidosis without coma: Secondary | ICD-10-CM | POA: Diagnosis not present

## 2016-06-16 DIAGNOSIS — Z8249 Family history of ischemic heart disease and other diseases of the circulatory system: Secondary | ICD-10-CM | POA: Diagnosis not present

## 2016-06-16 DIAGNOSIS — E781 Pure hyperglyceridemia: Secondary | ICD-10-CM | POA: Diagnosis not present

## 2016-06-16 DIAGNOSIS — Z833 Family history of diabetes mellitus: Secondary | ICD-10-CM | POA: Diagnosis not present

## 2016-06-16 LAB — GLUCOSE, POCT (MANUAL RESULT ENTRY): POC GLUCOSE: 310 mg/dL — AB (ref 70–99)

## 2016-06-16 NOTE — Patient Instructions (Addendum)
Make sure you call the health services of your colleges soon as you get to [place] and see how they can AG with this new diagnosis.    Make sure you establish with an endocrinologist down in [place] as soon as possible. If he cannot get in to see the endocrinologist soon established with any primary care physician.   Check your blood sugar every morning when your fasting, when he first wakes up and only drink water. Keep a log and write it down. Check your blood sugar whenever you feel bad, dizzy shaky just feel bad.  You can always check your blood sugars after meals and see how bad it is usually 2 hours after meals. He can write that down as well.   [email or web address].diabetes.org  Normal fasting blood sugars are 60- 100. Well controlled diabetes is under 126 and every time you check it shoot for round 150 to under 200 even after meals.      Type 1 Diabetes Mellitus, Adult Type 1 diabetes mellitus, often simply referred to as diabetes, is a long-term (chronic) disease. It occurs when the islet cells in the pancreas that make insulin (a hormone) are destroyed and can no longer make insulin. Insulin is needed to move sugars from food into the tissue cells. The tissue cells use the sugars for energy. In people with type 1 diabetes, the sugars build up in the blood instead of going into the tissue cells. As a result, high blood sugar (hyperglycemia) develops. Without insulin, the body breaks down fat cells for the needed energy. This breakdown of fat cells produces acid chemicals (ketones), which increases the acid levels in the body. The effect of either high ketone or high sugar (glucose) levels can be life-threatening.  Type 1 diabetes was also previously called juvenile diabetes. It most often occurs before the age of 21, but it can occur at any age. RISK FACTORS A person is predisposed to developing type 1 diabetes if someone in his or her family has the disease and is exposed to certain additional  environmental triggers.  SYMPTOMS  Symptoms of type 1 diabetes may develop gradually over days to weeks or suddenly. The symptoms occur due to hyperglycemia. The symptoms can include:   Increased thirst (polydipsia).  Increased urination (polyuria).  Increased urination during the night (nocturia).  Weight loss. This weight loss may be rapid.  Frequent, recurring infections.  Tiredness (fatigue).  Weakness.  Vision changes, such as blurred vision.  Fruity smell to your breath.  Abdominal pain.  Nausea or vomiting.  An open skin wound (ulcer). DIAGNOSIS  Type 1 diabetes is diagnosed when symptoms of diabetes are present and when blood glucose levels are increased. Your blood glucose level may be checked by one or more of the following blood tests:  A fasting blood glucose test. You will not be allowed to eat for at least 8 hours before a blood sample is taken.  A random blood glucose test. Your blood glucose is checked at any time of the day regardless of when you ate.  A hemoglobin A1c blood glucose test. A hemoglobin A1c test provides information about blood glucose control over the previous 3 months. TREATMENT  Although type 1 diabetes cannot be prevented, it can be managed with insulin, diet, and exercise.  You will need to take insulin daily to keep blood glucose in the desired range.  You will need to match insulin dosing with exercise and healthy food choices. Generally, the goal of treatment is to  maintain a pre-meal (preprandial) blood glucose level of 80-130 mg/dL. HOME CARE INSTRUCTIONS   Have your hemoglobin A1c level checked twice a year.  Perform daily blood glucose monitoring as directed by your health care provider.  Monitor urine ketones when you are ill and as directed by your health care provider.  Take your insulin as directed by your health care provider to maintain your blood glucose level in the desired range.  Never run out of insulin. It is  needed every day.  Adjust insulin based on your intake of carbohydrates. Carbohydrates can raise blood glucose levels but need to be included in your diet. Carbohydrates provide vitamins, minerals, and fiber, which are an essential part of a healthy diet. Carbohydrates are found in fruits, vegetables, whole grains, dairy products, legumes, and foods containing added sugars.  Eat healthy foods. Alternate 3 meals with 3 snacks.  Maintain a healthy weight.  Carry a medical alert card or wear your medical alert jewelry.  Carry a 15-gram carbohydrate snack with you at all times to treat low blood glucose (hypoglycemia). Some examples of 15-gram carbohydrate snacks include:  Glucose tablets, 3 or 4.  Glucose gel, 15-gram tube.  Raisins, 2 tablespoons (24 grams).  Jelly beans, 6.  Animal crackers, 8.  Fruit juice, regular soda, or low-fat milk, 4 ounces (120 mL).  Gummy treats, 9.  Recognize hypoglycemia. Hypoglycemia occurs with blood glucose levels of 70 mg/dL and below. The risk for hypoglycemia increases when fasting or skipping meals, during or after intense exercise, and during sleep. Hypoglycemia symptoms can include:  Tremors or shakes.  Decreased ability to concentrate.  Sweating.  Increased heart rate.  Headache.  Dry mouth.  Hunger.  Irritability.  Anxiety.  Restless sleep.  Altered speech or coordination.  Confusion.  Treat hypoglycemia promptly. If you are alert and able to safely swallow, follow the 15:15 rule:  Take 15-20 grams of rapid-acting glucose or carbohydrate. Rapid-acting options include glucose gel, glucose tablets, or 4 ounces (120 mL) of fruit juice, regular soda, or low-fat milk.  Check your blood glucose level 15 minutes after taking the glucose.  Take 15-20 grams more of glucose if the repeat blood glucose level is still 70 mg/dL or below.  Eat a meal or snack within 1 hour once blood glucose levels return to normal.  Be alert to  polyuria and polydipsia, which are early signs of hyperglycemia. An early awareness of hyperglycemia allows for prompt treatment. Treat hyperglycemia as directed by your health care provider.  Exercise regularly as directed by your health care provider. This includes:  Stretching and performing strength training exercises, such as yoga or weight lifting, at least 2 times per week.  Performing a total of at least 150 minutes of moderate-intensity exercise each week, such as brisk walking or water aerobics.  Exercising at least 3 days per week, making sure you allow no more than 2 consecutive days to pass without exercising.  Avoiding long periods of inactivity (90 minutes or more). When you have to spend an extended period of time sitting down, take frequent breaks to walk or stretch.  Adjust your insulin dosing and food intake as needed if you start a new exercise or sport.  Follow your sick-day plan at any time you are unable to eat or drink as usual.   Do not use any tobacco products including cigarettes, chewing tobacco, or electronic cigarettes. If you need help quitting, ask your health care provider.  Limit alcohol intake to no more than  1 drink per day for nonpregnant women and 2 drinks per day for men. You should drink alcohol only when you are also eating food. Talk with your health care provider about whether alcohol is safe for you. Tell your health care provider if you drink alcohol several times a week.  Keep all follow-up visits as directed by your health care provider.  Schedule an eye exam within 5 years of diagnosis and then annually.  Perform daily skin and foot care. Examine your skin and feet daily for cuts, bruises, redness, nail problems, bleeding, blisters, or sores. A foot exam should be done by a health care provider 5 years after diagnosis, and then every year after the first exam.  Brush your teeth and gums at least twice a day and floss at least once a day.  Follow up with your dentist regularly.  Share your diabetes management plan with your workplace or school.  Keep your immunizations up to date. It is recommended that you receive a flu (influenza) vaccine every year. It is also recommended that you receive a pneumonia (pneumococcal) vaccine. If you are 21 years of age or older and have never received a pneumonia vaccine, this vaccine may be given as a series of two separate shots. Ask your health care provider which additional vaccines may be recommended.  Learn to manage stress.  Obtain ongoing diabetes education and support as needed.  Participate in or seek rehabilitation as needed to maintain or improve independence and quality of life. Request a physical or occupational therapy referral if you are having foot or hand numbness, or difficulties with grooming, dressing, eating, or physical activity. SEEK MEDICAL CARE IF:   You are unable to eat food or drink fluids for more than 6 hours.  You have nausea and vomiting for more than 6 hours.  Your blood glucose level is over 240 mg/dL.  There is a change in mental status.  You develop an additional serious illness.  You have diarrhea for more than 6 hours.  You have been sick or have had a fever for a couple of days and are not getting better.  You have pain during any physical activity. SEEK IMMEDIATE MEDICAL CARE IF:  You have difficulty breathing.  You have moderate to large ketone levels. MAKE SURE YOU:  Understand these instructions.  Will watch your condition.  Will get help right away if you are not doing well or get worse.   This information is not intended to replace advice given to you by your health care provider. Make sure you discuss any questions you have with your health care provider.   Document Released: 10/13/2000 Document Revised: 07/07/2015 Document Reviewed: 05/14/2012 Elsevier Interactive Patient Education 2016 Elsevier Inc.   Blood Glucose  Monitoring, Adult Monitoring your blood glucose (also know as blood sugar) helps you to manage your diabetes. It also helps you and your health care provider monitor your diabetes and determine how well your treatment plan is working. WHY SHOULD YOU MONITOR YOUR BLOOD GLUCOSE?  It can help you understand how food, exercise, and medicine affect your blood glucose.  It allows you to know what your blood glucose is at any given moment. You can quickly tell if you are having low blood glucose (hypoglycemia) or high blood glucose (hyperglycemia).  It can help you and your health care provider know how to adjust your medicines.  It can help you understand how to manage an illness or adjust medicine for exercise. WHEN SHOULD YOU  TEST? Your health care provider will help you decide how often you should check your blood glucose. This may depend on the type of diabetes you have, your diabetes control, or the types of medicines you are taking. Be sure to write down all of your blood glucose readings so that this information can be reviewed with your health care provider. See below for examples of testing times that your health care provider may suggest. Type 1 Diabetes  Test at least 2 times per day if your diabetes is well controlled, if you are using an insulin pump, or if you perform multiple daily injections.  If your diabetes is not well controlled or if you are sick, you may need to test more often.  It is a good idea to also test:  Before every insulin injection.  Before and after exercise.  Between meals and 2 hours after a meal.  Occasionally between 2:00 a.m. and 3:00 a.m. Type 2 Diabetes  If you are taking insulin, test at least 2 times per day. However, it is best to test before every insulin injection.  If you take medicines by mouth (orally), test 2 times a day.  If you are on a controlled diet, test once a day.  If your diabetes is not well controlled or if you are sick, you  may need to monitor more often. HOW TO MONITOR YOUR BLOOD GLUCOSE Supplies Needed  Blood glucose meter.  Test strips for your meter. Each meter has its own strips. You must use the strips that go with your own meter.  A pricking needle (lancet).  A device that holds the lancet (lancing device).  A journal or log book to write down your results. Procedure  Wash your hands with soap and water. Alcohol is not preferred.  Prick the side of your finger (not the tip) with the lancet.  Gently milk the finger until a small drop of blood appears.  Follow the instructions that come with your meter for inserting the test strip, applying blood to the strip, and using your blood glucose meter. Other Areas to Get Blood for Testing Some meters allow you to use other areas of your body (other than your finger) to test your blood. These areas are called alternative sites. The most common alternative sites are:  The forearm.  The thigh.  The back area of the lower leg.  The palm of the hand. The blood flow in these areas is slower. Therefore, the blood glucose values you get may be delayed, and the numbers are different from what you would get from your fingers. Do not use alternative sites if you think you are having hypoglycemia. Your reading will not be accurate. Always use a finger if you are having hypoglycemia. Also, if you cannot feel your lows (hypoglycemia unawareness), always use your fingers for your blood glucose checks. ADDITIONAL TIPS FOR GLUCOSE MONITORING  Do not reuse lancets.  Always carry your supplies with you.  All blood glucose meters have a 24-hour "hotline" number to call if you have questions or need help.  Adjust (calibrate) your blood glucose meter with a control solution after finishing a few boxes of strips. BLOOD GLUCOSE RECORD KEEPING It is a good idea to keep a daily record or log of your blood glucose readings. Most glucose meters, if not all, keep your  glucose records stored in the meter. Some meters come with the ability to download your records to your home computer. Keeping a record of your  blood glucose readings is especially helpful if you are wanting to look for patterns. Make notes to go along with the blood glucose readings because you might forget what happened at that exact time. Keeping good records helps you and your health care provider to work together to achieve good diabetes management.    This information is not intended to replace advice given to you by your health care provider. Make sure you discuss any questions you have with your health care provider.   Document Released: 10/19/2003 Document Revised: 11/06/2014 Document Reviewed: 03/10/2013 Elsevier Interactive Patient Education [employer]2016 Elsevier Inc.

## 2016-06-16 NOTE — Progress Notes (Signed)
Impression and Recommendations:    1. Uncontrolled type 1 diabetes mellitus with ketoacidosis without coma (HCC)   2. Hypertriglyceridemia, familial   3. Family history of essential hypertension   4. Family history of early CAD- father died AMI age 21   5. Family history of diabetes mellitus (DM)      Advised to start the Lantus when he gets it, as he is very educated and very reliable-I know he will get in touch with us if he has any concerns or problems. If he is not already connected with a doctor down in [place], patient will keep me abreast and let me know if he has any symptoms and what his sugars are.  Goal blood sugars discussed with patient today.  Bring his meter into all office visits as his sugars will be started on it.  Prudent diet discussed with patient as well as exercise 30-40 minutes daily moderate intensity.  Reminded of low saturated and Transfats diet for his triglycerides in addition to the Niaspan. He understands risk benefits of meds.  Handouts given on blood glucose monitoring and his blood sugar goals. I also advised him to go to the ADA website and further educate himself about this condition.    Patient's Medications  New Prescriptions   No medications on file  Previous Medications   INSULIN GLARGINE (LANTUS SOLOSTAR) 100 UNIT/ML SOLOSTAR PEN    Inject 20 Units into the skin at bedtime. Please include QS 3 months.   NIACIN (NIASPAN) 1000 MG CR TABLET    Take 1 tablet (1,000 mg total) by mouth at bedtime.  Modified Medications   No medications on file  Discontinued Medications   No medications on file    Return in about 3 months (around 09/16/2016).  The patient was counseled, risk factors were discussed, anticipatory guidance given.  Gross side effects, risk and benefits, and alternatives of medications discussed with patient.  Patient is aware that all medications have potential side effects and we are unable to predict every side effect or  drug-drug interaction that may occur.  Expresses verbal understanding and consents to current therapy plan and treatment regimen.  Please see AVS handed out to patient at the end of our visit for further patient instructions/ counseling done pertaining to today's office visit.    Note: This document was prepared using Dragon voice recognition software and may include unintentional dictation errors.   --------------------------------------------------------------------------------------------------------------------------------------------------------------------------------------------------------------------------------------------    Subjective:    CC:  Chief Complaint  Patient presents with  . Diabetes    HPI: Cristian Kennedy Cong is a 20 y.o. male who [facility] for issues as discussed below.   Patient diagnosed with type 1 diabetes recently (2 days ago).  He tells me he is dealing better with the news.  His mother is with him in the office today- she is crying and tearful about the news.  She has many questions for me today about this diagnosis which I answered.    - Patient is leaving for [place] this weekend to go back to college. He will be seeking an endocrinologist down there. We are to be referred him to a [facility] practice down there. They have contacted him already.   ROS: Been feeling well.  No shakiness, dizziness, abdominal pain, polyuria, polydipsia, visual changes, neuropathies or numbness. Patient tells me he feels completely fine. No cardiopulmonary symptoms either.      I prescribed Lantus /insulin  and also Niaspan for hypertriglyceridemia last office visit but patient has not gotten the medicines.  He tells me today that his insurance failed to cover it. They are working with the pharmacy to see how to get coverage   Not checking fasting blood sugars.   He called insurance and he ordered a glucometer online and 1 is on  the way.    Mom, who has type 2 diabetes herself, requests we check his fasting blood sugars today.  Apparently she does not have a glucometer at home.     -Patient apparently had a history of recent weight loss approximately 1 year ago of 70 pounds pounds.   He did not try to have this.  He still denies a history of any polyuria or polydipsia or any other symptoms.   Patient admits today that he has a very poor diet of a lot of sugary drinks, candy, cookies, cakes etc.  Eats a lot of fast foods.    Wt Readings from Last 3 Encounters:  06/16/16 222 lb 1.6 oz (100.7 kg)  06/13/16 225 lb (102.1 kg)  06/07/16 221 lb (100.2 kg)   BP Readings from Last 3 Encounters:  06/16/16 115/75  06/13/16 108/67  06/07/16 131/72   Pulse Readings from Last 3 Encounters:  06/16/16 68  06/13/16 64  06/07/16 72     Patient Active Problem List   Diagnosis Date Noted  . Type 1 diabetes mellitus with ketoacidosis, uncontrolled (HCC) 06/13/2016  . Hypertriglyceridemia, familial 06/13/2016  . Family history of early CAD- father died AMI age 10350 06/07/2016  . Overweight (BMI 25.0-29.9) 06/07/2016  . Family history of essential hypertension 06/07/2016  . Family history of diabetes mellitus (DM) 06/07/2016    Past Medical history, Surgical history, Family history, Social history, Allergies and Medications have been entered into the medical record, reviewed and changed as needed.   Allergies:  No Known Allergies  Review of Systems: No fever/ chills, night sweats, no unintended weight loss, No chest pain, or increased shortness of breath. No N/V/D.  Pertinent positives and negatives noted in HPI above    Objective:   Blood pressure 115/75, pulse 68, weight 222 lb 1.6 oz (100.7 kg). Body mass index is 27.03 kg/m.  General: Well Developed, well nourished, appropriate for stated age.  Neuro: Alert and oriented x3, extra-ocular muscles intact, sensation grossly intact.  HEENT: Normocephalic, atraumatic,  neck supple   Skin: Warm and dry, no gross rash. Cardiac: RRR, S1 S2,  no murmurs rubs or gallops.  Respiratory: ECTA B/Kennedy, Not using accessory muscles, speaking in full sentences-unlabored. Vascular:  No gross lower ext edema, cap RF less 2 sec. Psych: No SI/HI, Insight and judgement good, Emotionally feeling much better than couple days ago, not tearful today

## 2016-06-17 LAB — MICROALBUMIN / CREATININE URINE RATIO
CREATININE, URINE: 165 mg/dL (ref 20–370)
MICROALB UR: 0.5 mg/dL
MICROALB/CREAT RATIO: 3 ug/mg{creat} (ref <30)

## 2016-06-20 ENCOUNTER — Encounter: Payer: Self-pay | Admitting: Family Medicine

## 2016-06-25 DIAGNOSIS — E559 Vitamin D deficiency, unspecified: Secondary | ICD-10-CM | POA: Insufficient documentation

## 2016-06-25 DIAGNOSIS — R7989 Other specified abnormal findings of blood chemistry: Secondary | ICD-10-CM | POA: Insufficient documentation

## 2016-06-25 NOTE — Assessment & Plan Note (Signed)
Exercising and dietary counseling done.

## 2016-06-25 NOTE — Assessment & Plan Note (Addendum)
-   Concern for diabetes mellitus type IB-  W/ elevated TSH and likely Hashimoto's thyroiditis  - We will refer to endocrinologist as they can obtain other diagnostic labs as they see fit in regards to patient's type IB status.  - Extensive counseling on diabetes type 1 done today--> Patient is in absolute shock today by this news and is tearful/ devastated. Pt was in the office today for 40+ minutes, with over 50% time spent in face to face counseling of various medical concerns and in coordination of care  - Blood glucose monitoring discussed with patient extensively and handouts provided  - Lantus at low dose starting at night- counseling done.   Told patient to please ask pharmacist to show him exactly how to use the prefilled syringes.   - I discussed he needs to sign up for my chart so that we can have close contact- and he can ask me any questions he may have until he gets in with the endocrinologist down in [place].   Referral placed today.  -  patient to follow-up with me within a couple days, since he will be going to [place] to go back to college in just 2-3 days

## 2016-06-25 NOTE — Assessment & Plan Note (Signed)
Patient does not know if he had diabetes are not as his father really never went to the doctors.

## 2016-06-25 NOTE — Assessment & Plan Note (Addendum)
Supplementation discussed with patient of 5000 IUs D3 daily including a multivitamin.  We'll repeat blood work in approximately 6 months or so when patient is back in town from college

## 2016-06-25 NOTE — Assessment & Plan Note (Signed)
>>  ASSESSMENT AND PLAN FOR TYPE 1 DIABETES MELLITUS WITHOUT COMPLICATION ([place]) WRITTEN ON 06/25/2016  9:26 AM BY OPALSKI, [place], DO  - Concern for diabetes mellitus type IB-  W/ elevated TSH and likely Hashimoto's thyroiditis  - We will refer to endocrinologist as they can obtain other diagnostic labs as they see fit in regards to patient's type IB status.  - Extensive counseling on diabetes type 1 done today--> Patient is in absolute shock today by this news and is tearful/ devastated. Pt was in the office today for 40+ minutes, with over 50% time spent in face to face counseling of various medical concerns and in coordination of care  - Blood glucose monitoring discussed with patient extensively and handouts provided  - Lantus at low dose starting at night- counseling done.   Told patient to please ask pharmacist to show him exactly how to use the prefilled syringes.   - I discussed he needs to sign up for my chart so that we can have close contact- and he can ask me any questions he may have until he gets in with the endocrinologist down in [place].   Referral placed today.  -  patient to follow-up with me within a couple days, since he will be going to [place] to go back to college in just 2-3 days

## 2016-06-25 NOTE — Assessment & Plan Note (Signed)
Discussed with patient the clinical significance of this.   I explained I would like to obtain further blood work from him yet we both determined he will follow-up with endocrinologist in [place] in regards to this since patient is completely asymptomatic and feeling overwhelmed as is with having to go on treatment for diabetes.

## 2016-06-25 NOTE — Assessment & Plan Note (Addendum)
-   Extensive counseling done. We will start B vitamin on patient today so that he doesn't feel so overwhelmed with all these different medications.  - Low saturated and Transfats diet discussed.   - Personal handwritten educational notes on his lab results sheets provided to patient to include AHA exercise guidelines and dietary modifications for hypertriglyceridemia and DM.

## 2016-08-08 ENCOUNTER — Ambulatory Visit: Admitting: Family Medicine

## 2017-05-07 ENCOUNTER — Encounter: Payer: Self-pay | Admitting: Family Medicine

## 2017-05-07 ENCOUNTER — Ambulatory Visit (INDEPENDENT_AMBULATORY_CARE_PROVIDER_SITE_OTHER): Admitting: Family Medicine

## 2017-05-07 VITALS — BP 128/81 | HR 64 | Ht 76.0 in | Wt 267.6 lb

## 2017-05-07 DIAGNOSIS — Z113 Encounter for screening for infections with a predominantly sexual mode of transmission: Secondary | ICD-10-CM

## 2017-05-07 DIAGNOSIS — E1065 Type 1 diabetes mellitus with hyperglycemia: Secondary | ICD-10-CM | POA: Diagnosis not present

## 2017-05-07 DIAGNOSIS — E669 Obesity, unspecified: Secondary | ICD-10-CM

## 2017-05-07 DIAGNOSIS — E559 Vitamin D deficiency, unspecified: Secondary | ICD-10-CM

## 2017-05-07 DIAGNOSIS — E781 Pure hyperglyceridemia: Secondary | ICD-10-CM

## 2017-05-07 DIAGNOSIS — Z0000 Encounter for general adult medical examination without abnormal findings: Secondary | ICD-10-CM

## 2017-05-07 DIAGNOSIS — Z719 Counseling, unspecified: Secondary | ICD-10-CM | POA: Diagnosis not present

## 2017-05-07 DIAGNOSIS — IMO0001 Reserved for inherently not codable concepts without codable children: Secondary | ICD-10-CM

## 2017-05-07 DIAGNOSIS — Z1389 Encounter for screening for other disorder: Secondary | ICD-10-CM

## 2017-05-07 DIAGNOSIS — H6023 Malignant otitis externa, bilateral: Secondary | ICD-10-CM

## 2017-05-07 DIAGNOSIS — E039 Hypothyroidism, unspecified: Secondary | ICD-10-CM | POA: Diagnosis not present

## 2017-05-07 DIAGNOSIS — E66811 Obesity, class 1: Secondary | ICD-10-CM

## 2017-05-07 MED ORDER — VITAMIN D3 125 MCG (5000 UT) PO TABS: ORAL_TABLET | ORAL | 3 refills | Status: DC

## 2017-05-07 MED ORDER — ACETIC ACID 2 % OT SOLN: 4.0000 [drp] | Freq: Three times a day (TID) | OTIC | 0 refills | Status: DC

## 2017-05-07 NOTE — Progress Notes (Signed)
Male physical  Impression and Recommendations:    1. Encounter for wellness examination   2. Health education/counseling   3. Screening for multiple conditions   4. Screening examination for sexually transmitted disease   5. Acquired hypothyroidism   6. Hypertriglyceridemia, familial   7. Uncontrolled type 1 diabetes mellitus without complication (HCC)   8. Vitamin D insufficiency   9. Malignant otitis externa of both ears, unspecified chronicity   10. Obesity, Class I, BMI 30-34.9    > Patient declines blood work today and says he is getting new insurance starting September 1.  He wishes to return to clinic then for all of his blood work from today   1. Diabetes:  Patient takes his Lantus nightly and takes NovoLog flex pen 8 units in the morning, 10 units at lunch and 10 units at dinner.  Additionally, he gives himself 1 unit for every 50 units over a BS of 150  2. Hypothyroidism:   Patient 50 g.  We will check his TSH levels near future.  3. Otitis externa:  prescription sent to pharmacy  Precautions and avoidance of water etc. discussed with patient.   4. For vitamin D deficiency: Told patient to take 5000 IUs over-the-counter  5. For triglycerides-  patient stopped his niacin.  We will check his cholesterol panel near future.  Discussed with patient low carbohydrate diet and avoid alcohol etc.    6. Weight:  Patient states he is usually around same weight he is now.  Initially he was thinner because of poorly controlled diabetes.      1) Anticipatory Guidance: Discussed importance of wearing a seatbelt while driving, not texting while driving;   sunscreen when outside along with skin surveillance; eating a balanced and modest diet; physical activity at least 25 minutes per day or 150 min/ week moderate to intense activity.  2) Immunizations / Screenings / Labs:  All immunizations are up-to-date per recommendations or will be updated today. Patient is due for dental  and vision screens which pt will schedule independently. Will obtain CBC, CMP, HgA1c, Lipid panel, TSH and vit D when fasting, if not already done recently.   3) Weight:  BMI meaning discussed with patient.  Discussed goal of losing 5-10% of current body weight which would improve overall feelings of well being and improve objective health data. Improve nutrient density of diet through increasing intake of fruits and vegetables and decreasing saturated fats, white flour products and refined sugars.   Meds ordered this encounter  Medications  . NOVOLOG FLEXPEN 100 UNIT/ML FlexPen    Sig: Inject into the skin. 8 units in am 10 units at lunch and 10 units at dinner  . DISCONTD: Insulin Glargine (LANTUS) 100 UNIT/ML Solostar Pen    Sig: Inject into the skin.  . Cholecalciferol (VITAMIN D3) 5000 units TABS    Sig: 5,000 IU OTC vitamin D3 daily.    Dispense:  90 tablet    Refill:  3  . levothyroxine (SYNTHROID, LEVOTHROID) 50 MCG tablet    Sig: Take 50 mcg by mouth daily before breakfast.  . acetic acid (VOSOL) 2 % otic solution    Sig: Place 4 drops into both ears 3 (three) times daily.    Dispense:  15 mL    Refill:  0    Gross side effects, risk and benefits, and alternatives of medications discussed with patient.  Patient is aware that all medications have potential side effects and we are unable to predict  every side effect or drug-drug interaction that may occur.  Expresses verbal understanding and consents to current therapy plan and treatment regimen.  Follow-up preventative CPE in 1 year. Follow-up office visit pending lab work.  F/up sooner for chronic care management and/or prn   Orders Placed This Encounter  Procedures  . CBC with Differential/Platelet    Standing Status:   Future    Standing Expiration Date:   05/07/2018  . Comprehensive metabolic panel    Standing Status:   Future    Standing Expiration Date:   05/07/2018    Order Specific Question:   Has the patient fasted?     Answer:   Yes  . Hemoglobin A1c    Standing Status:   Future    Standing Expiration Date:   05/07/2018  . Lipid panel    Standing Status:   Future    Standing Expiration Date:   05/07/2018    Order Specific Question:   Has the patient fasted?    Answer:   Yes  . Hepatitis C antibody    Standing Status:   Future    Standing Expiration Date:   05/07/2018  . T4, free    Standing Status:   Future    Standing Expiration Date:   05/07/2018  . TSH    Standing Status:   Future    Standing Expiration Date:   05/07/2018  . VITAMIN D 25 Hydroxy (Vit-D Deficiency, Fractures)    Standing Status:   Future    Standing Expiration Date:   05/07/2018  . HSV(herpes smplx)abs-1+2(IgG+IgM)-bld    Standing Status:   Future    Standing Expiration Date:   05/07/2018  . HIV antibody    Standing Status:   Future    Standing Expiration Date:   11/07/2018  . GC/chlamydia probe amp, urine    Standing Status:   Future    Standing Expiration Date:   11/07/2018  . RPR    Standing Status:   Future    Standing Expiration Date:   11/07/2018    Patient's Medications  New Prescriptions   ACETIC ACID (VOSOL) 2 % OTIC SOLUTION    Place 4 drops into both ears 3 (three) times daily.   CHOLECALCIFEROL (VITAMIN D3) 5000 UNITS TABS    5,000 IU OTC vitamin D3 daily.  Previous Medications   INSULIN GLARGINE (LANTUS SOLOSTAR) 100 UNIT/ML SOLOSTAR PEN    Inject 20 Units into the skin at bedtime. Please include QS 3 months.   LEVOTHYROXINE (SYNTHROID, LEVOTHROID) 50 MCG TABLET    Take 50 mcg by mouth daily before breakfast.   NOVOLOG FLEXPEN 100 UNIT/ML FLEXPEN    Inject into the skin. 8 units in am 10 units at lunch and 10 units at dinner  Modified Medications   No medications on file  Discontinued Medications   INSULIN GLARGINE (LANTUS) 100 UNIT/ML SOLOSTAR PEN    Inject into the skin.   NIACIN (NIASPAN) 1000 MG CR TABLET    Take 1 tablet (1,000 mg total) by mouth at bedtime.     Please see AVS handed out to patient at the end of  our visit for further patient instructions/ counseling done pertaining to today's office visit.    Subjective:    CC: CPE  HPI: Cristian Kennedy is a 21 y.o. male who presents to [facility] today for a yearly health maintenance exam.     Diabetes: Patient had been seen by [facility] provider -  Endo Doc down in [place] while he was going to college at [employer].   He has since graduated and is now transferring his care back up here.  Last A1c in March was 6.9.   When I initially diagnosed him 8\11\17 A1c was greater than 14.     Hypothyroidism:  Patient was seeing endocrinologist in [place].   on 50 g of levothyroxine.  Has some fatigue o/w Asx  Also complains of bilateral ear pain [place].  He denies any fevers.  He's had some discharge as well.  Denies headaches or mastoid or sinus tenderness.  Vitamin D deficiency:  Patient is not taking anything for this.  He does have some complaints of fatigue   Hypertriglyceridemia: Patient went off his medicines, he is not sure why  Health Maintenance Summary Reviewed and updated, unless pt declines services.  Aspirin: administering 81 mg daily Tobacco History Reviewed:   none Alcohol:    No concerns, no excessive use- avoid beer due to DM Exercise Habits:   Daily jogging STD concerns:   No sx but never got screened for STD's- no sex in many months, 10mo ago had oral sex only Drug Use:   None Birth control method:   N/a right now / condoms Testicular/penile concerns:     none   Health Maintenance  Topic Date Due  . HEMOGLOBIN A1C  12/10/2016  . FOOT EXAM  10/29/2017 (Originally 02/14/2005)  . PNEUMOCOCCAL POLYSACCHARIDE VACCINE (1) 05/03/2018 (Originally 02/14/1997)  . OPHTHALMOLOGY EXAM  05/07/2018 (Originally 02/14/2005)  . HIV Screening  05/07/2029 (Originally 02/14/2010)  . INFLUENZA VACCINE  05/30/2017  . URINE MICROALBUMIN  06/16/2017  . TETANUS/TDAP   10/30/2022      Wt Readings from Last 3 Encounters:  05/07/17 267 lb 9.6 oz (121.4 kg)  06/16/16 222 lb 1.6 oz (100.7 kg)  06/13/16 225 lb (102.1 kg)   BP Readings from Last 3 Encounters:  05/07/17 128/81  06/16/16 115/75  06/13/16 108/67   Pulse Readings from Last 3 Encounters:  05/07/17 64  06/16/16 68  06/13/16 64    Patient Active Problem List   Diagnosis Date Noted  . Hypothyroidism 05/07/2017    Priority: High  . Obesity, Class I, BMI 30-34.9 05/07/2017    Priority: High  . Type I diabetes mellitus, uncontrolled (HCC) 06/13/2016    Priority: High  . Hypertriglyceridemia, familial 06/13/2016    Priority: High  . Family history of early CAD- father died AMI age 21 2016-06-20    Priority: High  . Elevated TSH 06/25/2016    Priority: Medium  . Vitamin D insufficiency 06/25/2016    Priority: Low  . Family history of essential hypertension 06/20/2016  . Family history of diabetes mellitus (DM) 20-Jun-2016    Past Medical History:  Diagnosis Date  . Family history of diabetes mellitus (DM) Jun 20, 2016  . Family history of early CAD- father died AMI age 62 06/20/16  . Family history of essential hypertension 06-20-2016  . Overweight (BMI 25.0-29.9) 20-Jun-2016    Past Surgical History:  Procedure Laterality Date  . TONSILLECTOMY AND ADENOIDECTOMY    . TYMPANOSTOMY TUBE PLACEMENT      Family History  Problem Relation Age of Onset  . Cancer Mother        leukemia  . Hypertension Mother   . Alcohol abuse Father   . Stroke Father   . Alcohol abuse Paternal Uncle   . Diabetes Maternal Grandmother   .  Hypertension Maternal Grandmother   . Cancer Paternal Grandmother        stomach/liver  . Diabetes Paternal Grandmother   . Hypertension Paternal Grandmother   . Alcohol abuse Paternal Grandfather     History  Drug Use No  ,  History  Alcohol Use No  ,  History  Smoking Status  . Never Smoker  Smokeless Tobacco  . Never Used  ,  History  Sexual Activity   . Sexual activity: Not Currently    Patient's Medications  New Prescriptions   ACETIC ACID (VOSOL) 2 % OTIC SOLUTION    Place 4 drops into both ears 3 (three) times daily.   CHOLECALCIFEROL (VITAMIN D3) 5000 UNITS TABS    5,000 IU OTC vitamin D3 daily.  Previous Medications   INSULIN GLARGINE (LANTUS SOLOSTAR) 100 UNIT/ML SOLOSTAR PEN    Inject 20 Units into the skin at bedtime. Please include QS 3 months.   LEVOTHYROXINE (SYNTHROID, LEVOTHROID) 50 MCG TABLET    Take 50 mcg by mouth daily before breakfast.   NOVOLOG FLEXPEN 100 UNIT/ML FLEXPEN    Inject into the skin. 8 units in am 10 units at lunch and 10 units at dinner  Modified Medications   No medications on file  Discontinued Medications   INSULIN GLARGINE (LANTUS) 100 UNIT/ML SOLOSTAR PEN    Inject into the skin.   NIACIN (NIASPAN) 1000 MG CR TABLET    Take 1 tablet (1,000 mg total) by mouth at bedtime.    Patient has no known allergies.  Review of Systems: General:   Denies fever, chills, unexplained weight loss.  Optho/Auditory:   Denies visual changes, blurred vision/LOV Respiratory:   Denies SOB, DOE more than baseline levels.  Cardiovascular:   Denies chest pain, palpitations, new onset peripheral edema  Gastrointestinal:   Denies nausea, vomiting, diarrhea.  Genitourinary: Denies dysuria, freq/ urgency, flank pain or discharge from genitals.  Endocrine:     Denies hot or cold intolerance, polyuria, polydipsia. Musculoskeletal:   Denies unexplained myalgias, joint swelling, unexplained arthralgias, gait problems.  Skin:  Denies rash, suspicious lesions Neurological:     Denies dizziness, unexplained weakness, numbness  Psychiatric/Behavioral:   Denies mood changes, suicidal or homicidal ideations, hallucinations    Objective:     Blood pressure 128/81, pulse 64, height 6\' 4"  (1.93 m), weight 267 lb 9.6 oz (121.4 kg). Body mass index is 32.57 kg/m. General Appearance:    Alert, cooperative, no distress, appears  stated age  Head:    Normocephalic, without obvious abnormality, atraumatic  Eyes:    PERRL, conjunctiva/corneas clear, EOM's intact, fundi    benign, both eyes  Ears:    Normal TM's and external ear canals, both ears  Nose:   Nares normal, septum midline, mucosa normal, no drainage    or sinus tenderness  Throat:   Lips w/o lesion, mucosa moist, and tongue normal; teeth and   gums normal  Neck:   Supple, symmetrical, trachea midline, no adenopathy;    thyroid:  no enlargement/tenderness/nodules; no carotid   bruit or JVD  Back:     Symmetric, no curvature, ROM normal, no CVA tenderness  Lungs:     Clear to auscultation bilaterally, respirations unlabored, no       Wh/ R/ R  Chest Wall:    No tenderness or gross deformity; normal excursion   Heart:    Regular rate and rhythm, S1 and S2 normal, no murmur, rub   or gallop  Abdomen:  Soft, non-tender, bowel sounds active all four quadrants, NO   G/R/R, no masses, no organomegaly  Genitalia:    Ext genitalia: without lesion, no penile rash or discharge, no hernias appreciated   Rectal:   deferred  Extremities:   Extremities normal, atraumatic, no cyanosis or gross edema  Pulses:   2+ and symmetric all extremities  Skin:   Warm, dry, Skin color, texture, turgor normal, no obvious rashes or lesions  M-Sk:   Ambulates * 4 w/o difficulty, no gross deformities, tone WNL  Neurologic:   CNII-XII intact, normal strength, sensation and reflexes    Throughout Psych:  No HI/SI, judgement and insight good, Euthymic mood. Full Affect.

## 2017-05-07 NOTE — Patient Instructions (Signed)

## 2017-05-11 ENCOUNTER — Telehealth: Payer: Self-pay

## 2017-05-11 NOTE — Telephone Encounter (Signed)
Informed patient his Health exam certificate form is ready for pick up.  Copy sent to scan.

## 2017-06-22 ENCOUNTER — Ambulatory Visit: Admitting: Family Medicine

## 2017-09-26 ENCOUNTER — Telehealth: Payer: Self-pay | Admitting: Family Medicine

## 2017-09-26 ENCOUNTER — Other Ambulatory Visit: Payer: Self-pay

## 2017-09-26 MED ORDER — INSULIN GLARGINE 100 UNIT/ML SOLOSTAR PEN: 20.0000 [IU] | PEN_INJECTOR | Freq: Every day | SUBCUTANEOUS | 0 refills | Status: DC

## 2017-09-26 NOTE — Telephone Encounter (Signed)
Sent in 30 days per Dr. Sharee Holsterpalski. Patient will follow up in 30 days. MPulliam, CMA/RT(R)

## 2017-09-26 NOTE — Telephone Encounter (Signed)
Patient called requesting refill on lantus, sent to [facility] [address]. Patient is to follow up in 30 days. MPulliam, CMA/RT(R)

## 2017-09-26 NOTE — Telephone Encounter (Signed)
Patient is requesting a refill of his Lantus but needs it to go to [facility] [address]. Patient has switched insurances and needs to use that pharmacy

## 2017-09-28 ENCOUNTER — Other Ambulatory Visit: Payer: Self-pay

## 2017-09-28 DIAGNOSIS — E10649 Type 1 diabetes mellitus with hypoglycemia without coma: Secondary | ICD-10-CM

## 2017-09-28 MED ORDER — INSULIN DEGLUDEC 100 UNIT/ML ~~LOC~~ SOPN: 10.0000 [IU] | PEN_INJECTOR | Freq: Every day | SUBCUTANEOUS | 0 refills | Status: DC

## 2017-09-28 NOTE — Telephone Encounter (Signed)
Pharmacy sent over fax that states that lantus is not covered by patient's insurance.  Per Dr. Sharee Holsterpalski changed to Evaristo Buryresiba 100unit/ML pen. Patient is to inject 10 units daily.  Called patient to notify, left message to call the office. MPulliam, CMA/RT(R)

## 2017-12-27 ENCOUNTER — Other Ambulatory Visit: Payer: Self-pay | Admitting: Family Medicine

## 2017-12-27 DIAGNOSIS — E10649 Type 1 diabetes mellitus with hypoglycemia without coma: Secondary | ICD-10-CM

## 2018-01-09 ENCOUNTER — Ambulatory Visit (INDEPENDENT_AMBULATORY_CARE_PROVIDER_SITE_OTHER): Payer: BC Managed Care – PPO | Admitting: Family Medicine

## 2018-01-09 ENCOUNTER — Encounter: Payer: Self-pay | Admitting: Family Medicine

## 2018-01-09 VITALS — BP 136/84 | HR 76 | Ht 76.0 in | Wt 257.0 lb

## 2018-01-09 DIAGNOSIS — Z719 Counseling, unspecified: Secondary | ICD-10-CM | POA: Diagnosis not present

## 2018-01-09 DIAGNOSIS — E039 Hypothyroidism, unspecified: Secondary | ICD-10-CM

## 2018-01-09 DIAGNOSIS — E10649 Type 1 diabetes mellitus with hypoglycemia without coma: Secondary | ICD-10-CM

## 2018-01-09 DIAGNOSIS — E781 Pure hyperglyceridemia: Secondary | ICD-10-CM | POA: Diagnosis not present

## 2018-01-09 DIAGNOSIS — E669 Obesity, unspecified: Secondary | ICD-10-CM

## 2018-01-09 DIAGNOSIS — Z8249 Family history of ischemic heart disease and other diseases of the circulatory system: Secondary | ICD-10-CM | POA: Diagnosis not present

## 2018-01-09 DIAGNOSIS — E559 Vitamin D deficiency, unspecified: Secondary | ICD-10-CM

## 2018-01-09 LAB — POCT UA - MICROALBUMIN
Albumin/Creatinine Ratio, Urine, POC: <30
Creatinine, POC: 30 mg/dL
Microalbumin Ur, POC: 10 mg/L

## 2018-01-09 LAB — POCT GLYCOSYLATED HEMOGLOBIN (HGB A1C): HEMOGLOBIN A1C: 9.4

## 2018-01-09 MED ORDER — NOVOLOG FLEXPEN 100 UNIT/ML ~~LOC~~ SOPN: PEN_INJECTOR | SUBCUTANEOUS | 2 refills | Status: DC

## 2018-01-09 MED ORDER — INSULIN GLARGINE 100 UNIT/ML SOLOSTAR PEN: 30.0000 [IU] | PEN_INJECTOR | Freq: Every day | SUBCUTANEOUS | 2 refills | Status: DC

## 2018-01-09 NOTE — Patient Instructions (Signed)
Lantus dose 30- units at bedtime.  Please add Humalog: - 10 units before a smaller meal - 15 units before a larger meal  Add the following Sliding Scale: - 150-175: + 1 unit  - 176-200: + 2 units  - 201-225: + 3 units  - 226-250: + 4 units  - 251-275: + 5 units - 276-300: + 6 units  Please return in 1.5 months with your sugar log.    Basic Rules for Patients with Type I Diabetes Mellitus  1. The American Diabetes Association (ADA) recommended targets: - fasting sugar <130 - after meal sugar <180 - HbA1C <7%  2. Engage in ?150 min moderate exercise per week  3. Make sure you have ?8h of sleep every night as this helps both blood sugars and your weight.  4. Always keep a sugar log (not only record in your meter) and bring it to all appointments with us.  5. "15-15 rule" for hypoglycemia: if sugars are low, take 15 g of carbs** ("fast sugar" - e.g. 4 glucose tablets, 4 oz orange juice), wait 15 min, then check sugars again. If still <80, repeat. Continue  until your sugars >80, then eat a normal meal.   6. Teach family members and coworkers to inject glucagon. Have a glucagon set at home and one at work. They should call 911 after using the set.  7. Check sugar before driving. If <100, correct, and only start driving if sugars rise ?409100. Check sugar every hour when on a long drive.  8. Check sugar before exercising. If <100, correct, and only start exercising if sugars rise ?100. Check sugar every hour when on a long exercise routine and 1h after you finished exercising.   If >250, check urine for ketones. If you have moderate-large ketones in urine, do not start exercise. Hydrate yourself with clear liquids and correct the high sugar. Recheck sugars and ketones before attempting to exercise.  Be aware that you might need less insulin when exercising.  *intense, short, exercise bursts can increase your sugars, but  *less intense, longer (>1h), exercise routines  can decrease your sugars.   9. Make sure you have a MedAlert bracelet or pendant mentioning "Type I Diabetes Mellitus". If you have a prior episode of severe hypoglycemia or hypoglycemia unawareness, it should also mention this.  10. Please do not walk barefoot. Inspect your feet for sores/cuts and let us know if you have them.  11. Please call [facility] with any questions and concerns [phone]).   **E.g. of "fast carbs":  first choice (15 g):  1 tube glucose gel, GlucoPouch 15, 2 oz glucose liquid  second choice (15-16 g):  3 or 4 glucose tablets (best taken      with water), 15 Dextrose Bits chewable  third choice (15-20 g):   cup fruit juice,  cup regular soda, 1 cup skim milk,  1 cup sports drink  fourth choice (15-20 g):  1 small tube [place]ate gel (not frosting), 2 tbsp raisins, 1 tbsp table sugar,  candy, jelly beans, gum drops - check package for carb amount   (adapted from: Juluis RainierMcCall A.L. "Insulin therapy and hypoglycemia" Endocrinol Metab Clin N Am 2012, 41: 57-87)

## 2018-01-09 NOTE — Progress Notes (Signed)
Impression and Recommendations:    1. Uncontrolled type 1 diabetes mellitus with hypoglycemia without coma (HCC)   2. Health education/counseling   3. Acquired hypothyroidism   4. Hypertriglyceridemia, familial   5. Vitamin D insufficiency   6. Family history of early CAD- father died AMI age 23   7. Obesity, Class I, BMI 30-34.9      1. DM type 1-  -  A1c today is 9.4, up from 01-02-17 where it was 6.9. This was 14+ on 06-09-16 during first encounter with pt. Pt admits to very poor diet and no exercise. -Dose change: increase Lantus to 30 units every night up from 26. -have better insulin coverage throughout the day- discussed sliding scale insulin and how to use to cover each meal.  Handouts provided. -Check your blood sugars 4x a day.   Bring a log into next OV. -pt understands if his A1c remains to be >9.0, referral will be given to endocrinology for further evaluation and consideration for implantable device. -cut back on the carbs-Long discussion with patient regarding healthy choices.  -A1c goal <7.0.    2. Health education/counseling- had discussion with pt about detrimental effects of his blood sugar, especially at this poorly controlled level. Discussed with pt sliding scale insulin and how to check blood sugars prior to eating and cover for carbs and meals. Declines referral to nutrition again and says he has all the materials to refresh himself at home from prior visit with diabetic educator and diabetic nutritionist. Declines referral today.  3. Acquired hypothyroidism- continue synthroid. - Stable, last checked TSH 1.8 on 03-06-2017, free T4 1.1. Thyroid peroxidase AB was 13.   4. Hypertriglyceridemia -on 01-02-17 :   triglycerides 108, HDL 45, VLDL 22, LDL 44. -will check fasting lipids next OV.  5. Vit D insufficiency- continue OTC supplements. Recheck next OV.  6. FMHx early CAD- pt understands importance of well-controlled blood sugars and routine care due to  increased risk of disease secondary to genetics.  7. Obesity class I- recommended pt to lose weight. Get back on diet and exercise.     Patient created goals during today's office visit:   1) Exercise 5 days a week, 1 hour of cardio. Can do weights, but in a cardio capacity.  2) only water- no sodas! 3) be more consistent with meds and checking blood sugars     Education and routine counseling performed. Handouts provided.  Orders Placed This Encounter  Procedures  . POCT glycosylated hemoglobin (Hb A1C)  . POCT UA - Microalbumin  . HM Diabetes Foot Exam    Meds ordered this encounter  Medications  . NOVOLOG FLEXPEN 100 UNIT/ML FlexPen    Sig: 10-20 units 3 times daily with meals    Dispense:  15 mL    Refill:  2  . Insulin Glargine (LANTUS SOLOSTAR) 100 UNIT/ML Solostar Pen    Sig: Inject 30 Units into the skin at bedtime. Please include QS 3 months.    Dispense:  5 pen    Refill:  2     Return for f/up 6 wks - bring in BS and BP log. TAKE ALL MEDS; FBW next OV.   The patient was counseled, risk factors were discussed, anticipatory guidance given.  Gross side effects, risk and benefits, and alternatives of medications discussed with patient.  Patient is aware that all medications have potential side effects and we are unable to predict every side effect or drug-drug interaction that may occur.  Expresses verbal understanding and consents to current therapy plan and treatment regimen.  Please see AVS handed out to patient at the end of our visit for further patient instructions/ counseling done pertaining to today's office visit.    Note: This document was prepared using Dragon voice recognition software and may include unintentional dictation errors.   This document serves as a record of services personally performed by Thomasene Lot, DO. It was created on her behalf by Thelma Barge, a trained medical scribe. The creation of this record is based on the scribe's  personal observations and the provider's statements to them.   I have reviewed the above medical documentation for accuracy and completeness and I concur.  Thomasene Lot 01/09/18 7:25 PM   Subjective:    Chief Complaint  Patient presents with  . Follow-up     Cristian Kennedy is a 22 y.o. male who presents to [facility] today for Diabetes Management.    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 He states when his previous A1c was 6.9, he got relaxed and let his diet slip. He lives with his mother and she does not cook healthy all the time.   DM HPI: -  He has not been working on diet and exercise for diabetes- he states recently he has not been eating as healthy as he should.   Pt is currently maintained on the following medications for diabetes:   see med list today.  Medication compliance - mostly, yes, but he has missed about 1 insulin shot a week due to scheduling with wrestling.  He checks his blood sugar usually before meals, and he will take the 26 units at night without checking his blood sugar.   He will eat a regular breakfast and lunch, but when he gets home for dinner, he eats very poorly. He has not been exercising as much recently as well.   Home glucose readings range: 120-130 fasting and between 100-200 after meals   Denies polyuria/polydipsia. Denies hypo/ hyperglycemia symptoms - He denies new onset of: chest pain, exercise intolerance, shortness of breath, dizziness, visual changes, headache, lower extremity swelling or claudication.   Last diabetic eye exam was No results found for: HMDIABEYEEXA  Foot exam- UTD  Last A1C in the office was:  Lab Results    Component Value Date   HGBA1C 9.4 01/09/2018   HGBA1C >14.0 06/09/2016    Lab Results  Component Value Date   MICROALBUR 10 01/09/2018   LDLCALC NOT CALC 06/09/2016   CREATININE 1.06 06/09/2016      Last 3 blood pressure readings in our office are as follows: BP Readings from Last 3 Encounters:  01/09/18 136/84  05/07/17 128/81  06/16/16 115/75    BMI Readings from Last 3 Encounters:  01/09/18 31.28 kg/m  05/07/17 32.57 kg/m  06/16/16 27.03 kg/m     No problems updated.    Patient Care Team    Relationship Specialty Notifications Start End  Thomasene Lot, DO PCP - General Family Medicine  06/07/16      Patient Active Problem List   Diagnosis Date Noted  . Hypothyroidism 05/07/2017    Priority:  High  . Obesity, Class I, BMI 30-34.9 05/07/2017    Priority: High  . Type I diabetes mellitus, uncontrolled (HCC) 06/13/2016    Priority: High  . Hypertriglyceridemia, familial 06/13/2016    Priority: High  . Family history of early CAD- father died AMI age 30 2016-07-06    Priority: High  . Elevated TSH 06/25/2016    Priority: Medium  . Vitamin D insufficiency 06/25/2016    Priority: Low  . Family history of essential hypertension July 06, 2016  . Family history of diabetes mellitus (DM) 2016/07/06     Past Medical History:  Diagnosis Date  . Family history of diabetes mellitus (DM) 07-06-2016  . Family history of early CAD- father died AMI age 52 2016/07/06  . Family history of essential hypertension 2016-07-06  . Overweight (BMI 25.0-29.9) 2016/07/06     Past Surgical History:  Procedure Laterality Date  . TONSILLECTOMY AND ADENOIDECTOMY    . TYMPANOSTOMY TUBE PLACEMENT       Family History  Problem Relation Age of Onset  . Cancer Mother        leukemia  . Hypertension Mother   . Alcohol abuse Father   . Stroke Father   . Alcohol abuse Paternal Uncle   . Diabetes Maternal Grandmother   . Hypertension Maternal Grandmother   . Cancer Paternal  Grandmother        stomach/liver  . Diabetes Paternal Grandmother   . Hypertension Paternal Grandmother   . Alcohol abuse Paternal Grandfather      Social History   Substance and Sexual Activity  Drug Use No  ,  Social History   Substance and Sexual Activity  Alcohol Use No  ,  Social History   Tobacco Use  Smoking Status Never Smoker  Smokeless Tobacco Never Used  ,    Current Outpatient Medications on File Prior to Visit  Medication Sig Dispense Refill  . Cholecalciferol (VITAMIN D3) 5000 units TABS 5,000 IU OTC vitamin D3 daily. 90 tablet 3  . levothyroxine (SYNTHROID, LEVOTHROID) 50 MCG tablet Take 50 mcg by mouth daily before breakfast.    . insulin degludec (TRESIBA FLEXTOUCH) 100 UNIT/ML SOPN FlexTouch Pen Inject 0.1 mLs (10 units total) into the skin daily at 10 PM. Patient needs office visit for further refills. (Patient not taking: Reported on 01/09/2018) 3 pen 0   No current facility-administered medications on file prior to visit.      No Known Allergies   Review of Systems:   General:  Denies fever, chills Optho/Auditory:   Denies visual changes, blurred vision Respiratory:   Denies SOB, cough, wheeze, DIB  Cardiovascular:   Denies chest pain, palpitations, painful respirations Gastrointestinal:   Denies nausea, vomiting, diarrhea.  Endocrine:     Denies new hot or cold intolerance Musculoskeletal:  Denies joint swelling, gait issues, or new unexplained myalgias/ arthralgias Skin:  Denies rash, suspicious lesions  Neurological:    Denies dizziness, unexplained weakness, numbness  Psychiatric/Behavioral:   Denies mood changes    Objective:     Blood pressure 136/84, pulse 76, height 6\' 4"  (1.93 m), weight 257 lb (116.6 kg), SpO2 100 %.  Body mass index is 31.28 kg/m.  General: Well Developed, well nourished, and in no acute distress.  HEENT: Normocephalic, atraumatic, pupils equal round reactive to light, neck supple, No carotid bruits, no  JVD Skin: Warm and dry, cap RF less 2 sec Cardiac: Regular rate and rhythm, S1, S2 WNL's, no murmurs rubs or gallops Respiratory: ECTA B/L, Not using  accessory muscles, speaking in full sentences. NeuroM-Sk: Ambulates w/o assistance, moves ext * 4 w/o difficulty, sensation grossly intact.  Ext: scant edema b/l lower ext Psych: No HI/SI, judgement and insight good, Euthymic mood. Full Affect.

## 2018-01-29 ENCOUNTER — Other Ambulatory Visit: Payer: Self-pay | Admitting: Family Medicine

## 2018-01-29 DIAGNOSIS — E10649 Type 1 diabetes mellitus with hypoglycemia without coma: Secondary | ICD-10-CM

## 2018-01-29 NOTE — Telephone Encounter (Signed)
Called patient left message to call the office. MPulliam, CMA/RT(R)  

## 2018-02-21 ENCOUNTER — Ambulatory Visit: Payer: BC Managed Care – PPO | Admitting: Family Medicine

## 2018-02-21 ENCOUNTER — Encounter: Payer: Self-pay | Admitting: Family Medicine

## 2018-02-21 VITALS — BP 132/88 | HR 71 | Ht 76.0 in | Wt 262.3 lb

## 2018-02-21 DIAGNOSIS — E669 Obesity, unspecified: Secondary | ICD-10-CM

## 2018-02-21 DIAGNOSIS — Z79899 Other long term (current) drug therapy: Secondary | ICD-10-CM | POA: Diagnosis not present

## 2018-02-21 DIAGNOSIS — E10649 Type 1 diabetes mellitus with hypoglycemia without coma: Secondary | ICD-10-CM

## 2018-02-21 DIAGNOSIS — R030 Elevated blood-pressure reading, without diagnosis of hypertension: Secondary | ICD-10-CM | POA: Insufficient documentation

## 2018-02-21 NOTE — Progress Notes (Signed)
Impression and Recommendations:    1. Uncontrolled type 1 diabetes mellitus with hypoglycemia without coma (HCC)   2. Borderline hypertension   3. Obesity, Class I, BMI 30-34.9   4. High risk medications (not anticoagulants) long-term use     1. DM- -Last A1c was 9.4 on 01-09-18. Recheck on or near 04-11-18. -Continue checking fasting and 2 hour postprandial blood sugars. Keep a log and bring this into next OV.  -continue taking meds as prescribed.  -pt has endocrinologist, Dr. Jonette Kennedy, who is watching him closely.   2. Borderline HTN: Recheck BP: 132/88 -Pt prefers to make lifestyle modifications instead of starting meds.  -Cut out drinking soda. Drink more water.  -Told pt to check his BP at home, but per pt, he does not have a way to check it.  -If BP is not <130/80 at next chronic OV (around 04-11-18), we will start meds. Pt agreed.   3. Obesity -Recommend losing weight. This will also improve his other comorbidities like HTN and DM.  4. High Risk medications -Discussed what to do for lows, prevention strategies, and he must be consistent with diet/exercise to prevent diseases these from progressing.    Education and routine counseling performed. Handouts provided.  No orders of the defined types were placed in this encounter.   No orders of the defined types were placed in this encounter.   Return for Follow-up around 04/11/2018 for A1c, recheck blood pressure, discussed weight loss.   The patient was counseled, risk factors were discussed, anticipatory guidance given.  Gross side effects, risk and benefits, and alternatives of medications discussed with patient.  Patient is aware that all medications have potential side effects and we are unable to predict every side effect or drug-drug interaction that may occur.  Expresses verbal understanding and consents to current therapy plan and treatment regimen.  Please see AVS handed out to patient at the end of our  visit for further patient instructions/ counseling done pertaining to today's office visit.    Note: This document was prepared using Dragon voice recognition software and may include unintentional dictation errors.  This document serves as a record of services personally performed by Cristian Loteborah Kanav Kazmierczak, DO. It was created on her behalf by Cristian Kennedy, a trained medical scribe. The creation of this record is based on the scribe's personal observations and the provider's statements to them.   I have reviewed the above medical documentation for accuracy and completeness and I concur.  Cristian Kennedy 02/28/18 2:45 PM    Subjective:    Chief Complaint  Patient presents with  . Follow-up    Cristian Kennedy is a 22 y.o. male who [facility] Diabetes Management.    DM HPI: Last A1c was 9.4 on 01-09-18.  -  He has been working on diet and exercise for diabetes.  Pt is currently maintained on the following medications for diabetes:   see med list today  Medication compliance - yes.  He was taking 15 of insulin which gave him some lows, between 50 and 70s. He is trying to find the sweet spot between 10 recommended by endocrinologist and 15, recommended by me. He feels weak in his knees and describes this as "a soreness similarly to afterwards when you work out". When he feels lows, he takes pineapple juice to raise his BS.   Home glucose readings range: Fasting: 88-121, with some deviations due to eating  poorly for his birthday. Average is about 100s. 2 hour post prandial   Denies polyuria/polydipsia. Denies hypo/ hyperglycemia symptoms - He denies new onset of: chest pain, exercise intolerance, shortness of breath, dizziness, visual changes, headache, lower extremity swelling or claudication.   Last diabetic eye exam was No results found for: HMDIABEYEEXA  Foot exam- UTD  HTN HPI:  -  His blood pressure has not been controlled  at home.  Pt is not checking it at home.   - Patient reports good compliance with blood pressure medications  - Denies medication S-E   - Smoking Status noted   - He denies new onset of: chest pain, exercise intolerance, shortness of breath, dizziness, visual changes, headache, lower extremity swelling or claudication.    Last 3 blood pressure readings in our office are as follows: BP Readings from Last 3 Encounters:  02/21/18 132/88  01/09/18 136/84  05/07/17 128/81    Filed Weights   02/21/18 1039  Weight: 262 lb 4.8 oz (119 kg)     Last A1C in the office was:  Lab Results  Component Value Date   HGBA1C 9.4 01/09/2018   HGBA1C >14.0 06/09/2016    Lab Results  Component Value Date   MICROALBUR 10 01/09/2018   LDLCALC NOT CALC 06/09/2016   CREATININE 1.06 06/09/2016      Last 3 blood pressure readings in our office are as follows: BP Readings from Last 3 Encounters:  02/21/18 132/88  01/09/18 136/84  05/07/17 128/81    BMI Readings from Last 3 Encounters:  02/21/18 31.93 kg/m  01/09/18 31.28 kg/m  05/07/17 32.57 kg/m     No problems updated.    Patient Care Team    Relationship Specialty Notifications Start End  Cristian Lot, DO PCP - General Family Medicine  July 05, 2016      Patient Active Problem List   Diagnosis Date Noted  . Hypothyroidism 05/07/2017    Priority: High  . Obesity, Class I, BMI 30-34.9 05/07/2017    Priority: High  . Type I diabetes mellitus, uncontrolled (HCC) 06/13/2016    Priority: High  . Hypertriglyceridemia, familial 06/13/2016    Priority: High  . Family history of early CAD- father died AMI age 33 July 05, 2016    Priority: High  . Elevated TSH 06/25/2016    Priority: Medium  . Vitamin D insufficiency 06/25/2016    Priority: Low  . Borderline hypertension 02/21/2018  . High risk medications (not anticoagulants) long-term use 02/21/2018  . Family history of essential hypertension July 05, 2016  . Family history  of diabetes mellitus (DM) 05-Jul-2016     Past Medical History:  Diagnosis Date  . Family history of diabetes mellitus (DM) 07-05-16  . Family history of early CAD- father died AMI age 63 2016/07/05  . Family history of essential hypertension July 05, 2016  . Overweight (BMI 25.0-29.9) 2016/07/05     Past Surgical History:  Procedure Laterality Date  . TONSILLECTOMY AND ADENOIDECTOMY    . TYMPANOSTOMY TUBE PLACEMENT       Family History  Problem Relation Age of Onset  . Cancer Mother        leukemia  . Hypertension Mother   . Alcohol abuse Father   . Stroke Father   . Alcohol abuse Paternal Uncle   . Diabetes Maternal Grandmother   . Hypertension Maternal Grandmother   . Cancer Paternal Grandmother        stomach/liver  . Diabetes Paternal Grandmother   . Hypertension Paternal Grandmother   . Alcohol  abuse Paternal Grandfather      Social History   Substance and Sexual Activity  Drug Use No  ,  Social History   Substance and Sexual Activity  Alcohol Use No  ,  Social History   Tobacco Use  Smoking Status Never Smoker  Smokeless Tobacco Never Used  ,    Current Outpatient Medications on File Prior to Visit  Medication Sig Dispense Refill  . Cholecalciferol (VITAMIN D3) 5000 units TABS 5,000 IU OTC vitamin D3 daily. 90 tablet 3  . Insulin Glargine (LANTUS SOLOSTAR) 100 UNIT/ML Solostar Pen Inject 30 Units into the skin at bedtime. Please include QS 3 months. 5 pen 2  . levothyroxine (SYNTHROID, LEVOTHROID) 50 MCG tablet Take 50 mcg by mouth daily before breakfast.    . NOVOLOG FLEXPEN 100 UNIT/ML FlexPen 10-20 units 3 times daily with meals 15 mL 2   No current facility-administered medications on file prior to visit.      No Known Allergies   Review of Systems:   General:  Denies fever, chills Optho/Auditory:   Denies visual changes, blurred vision Respiratory:   Denies SOB, cough, wheeze, DIB  Cardiovascular:   Denies chest pain, palpitations, painful  respirations Gastrointestinal:   Denies nausea, vomiting, diarrhea.  Endocrine:     Denies new hot or cold intolerance Musculoskeletal:  Denies joint swelling, gait issues, or new unexplained myalgias/ arthralgias Skin:  Denies rash, suspicious lesions  Neurological:    Denies dizziness, unexplained weakness, numbness  Psychiatric/Behavioral:   Denies mood changes    Objective:     Blood pressure 132/88, pulse 71, height 6\' 4"  (1.93 m), weight 262 lb 4.8 oz (119 kg), SpO2 98 %.  Body mass index is 31.93 kg/m.  General: Well Developed, well nourished, and in no acute distress.  HEENT: Normocephalic, atraumatic, pupils equal round reactive to light, neck supple, No carotid bruits, no JVD Skin: Warm and dry, cap RF less 2 sec Cardiac: Regular rate and rhythm, S1, S2 WNL's, no murmurs rubs or gallops Respiratory: ECTA B/L, Not using accessory muscles, speaking in full sentences. NeuroM-Sk: Ambulates w/o assistance, moves ext * 4 w/o difficulty, sensation grossly intact.  Ext: scant edema b/l lower ext Psych: No HI/SI, judgement and insight good, Euthymic mood. Full Affect.

## 2018-02-21 NOTE — Patient Instructions (Addendum)
Keep up the great job with your diabetes.  Your numbers look well.  Goal blood pressure is to be consistently under 130/80.  Also weight loss will help this as we discussed.  Please see guidelines and further information below.     Diabetes Mellitus and Standards of Medical Care  Managing diabetes (diabetes mellitus) can be complicated. Your diabetes treatment may be managed by a team of health care providers, including:  A diet and nutrition specialist (registered dietitian).  A nurse.  A certified diabetes educator (CDE).  A diabetes specialist (endocrinologist).  An eye doctor.  A primary care provider.  A dentist.  Your health care providers follow a schedule in order to help you get the best quality of care. The following schedule is a general guideline for your diabetes management plan. Your health care providers may also give you more specific instructions.  HbA1c (hemoglobin A1c) test This test provides information about blood sugar (glucose) control over the previous 2-3 months. It is used to check whether your diabetes management plan needs to be adjusted.  If you are meeting your treatment goals, this test is done at least 2 times a year.  If you are not meeting treatment goals or if your treatment goals have changed, this test is done 4 times a year.  Blood pressure test  This test is done at every routine medical visit. For most people, the goal is less than 130/80. Ask your health care provider what your goal blood pressure should be.  Dental and eye exams  Visit your dentist two times a year.  If you have type 1 diabetes, get an eye exam 3-5 years after you are diagnosed, and then once a year after your first exam. ? If you were diagnosed with type 1 diabetes as a child, get an eye exam when you are age 26 or older and have had diabetes for 3-5 years. After the first exam, you should get an eye exam once a year.  If you have type 2 diabetes, have an eye exam  as soon as you are diagnosed, and then once a year after your first exam.  Foot care exam  Visual foot exams are done at every routine medical visit. The exams check for cuts, bruises, redness, blisters, sores, or other problems with the feet.  A complete foot exam is done by your health care provider once a year. This exam includes an inspection of the structure and skin of your feet, and a check of the pulses and sensation in your feet. ? Type 1 diabetes: Get your first exam 3-5 years after diagnosis. ? Type 2 diabetes: Get your first exam as soon as you are diagnosed.  Check your feet every day for cuts, bruises, redness, blisters, or sores. If you have any of these or other problems that are not healing, contact your health care provider.  Kidney function test (urine microalbumin)  This test is done once a year. ? Type 1 diabetes: Get your first test 5 years after diagnosis. ? Type 2 diabetes: Get your first test as soon as you are diagnosed._  If you have chronic kidney disease (CKD), get a serum creatinine and estimated glomerular filtration rate (eGFR) test once a year.  Lipid profile (cholesterol, HDL, LDL, triglycerides)  This test should be done when you are diagnosed with diabetes, and every 5 years after the first test. If you are on medicines to lower your cholesterol, you may need to get this test done  every year. ? The goal for LDL is less than 100 mg/dL (5.5 mmol/L). If you are at high risk, the goal is less than 70 mg/dL (3.9 mmol/L). ? The goal for HDL is 40 mg/dL (2.2 mmol/L) for men and 50 mg/dL(2.8 mmol/L) for women. An HDL cholesterol of 60 mg/dL (3.3 mmol/L) or higher gives some protection against heart disease. ? The goal for triglycerides is less than 150 mg/dL (8.3 mmol/L).  Immunizations  The yearly flu (influenza) vaccine is recommended for everyone 6 months or older who has diabetes.  The pneumonia (pneumococcal) vaccine is recommended for everyone 2 years  or older who has diabetes. If you are 13 or older, you may get the pneumonia vaccine as a series of two separate shots.  The hepatitis B vaccine is recommended for adults shortly after they have been diagnosed with diabetes.  The Tdap (tetanus, diphtheria, and pertussis) vaccine should be given: ? According to normal childhood vaccination schedules, for children. ? Every 10 years, for adults who have diabetes.  The shingles vaccine is recommended for people who have had chicken pox and are 50 years or older.  Mental and emotional health  Screening for symptoms of eating disorders, anxiety, and depression is recommended at the time of diagnosis and afterward as needed. If your screening shows that you have symptoms (you have a positive screening result), you may need further evaluation and be referred to a mental health care provider.  Diabetes self-management education  Education about how to manage your diabetes is recommended at diagnosis and ongoing as needed.  Treatment plan  Your treatment plan will be reviewed at every medical visit.  Summary  Managing diabetes (diabetes mellitus) can be complicated. Your diabetes treatment may be managed by a team of health care providers.  Your health care providers follow a schedule in order to help you get the best quality of care.  Standards of care including having regular physical exams, blood tests, blood pressure monitoring, immunizations, screening tests, and education about how to manage your diabetes.  Your health care providers may also give you more specific instructions based on your individual health.      Type 2 Diabetes Mellitus, Self Care, Adult Caring for yourself after you have been diagnosed with type 2 diabetes (type 2 diabetes mellitus) means keeping your blood sugar (glucose) under control with a balance of:  Nutrition.  Exercise.  Lifestyle changes.  Medicines or insulin, if necessary.  Support from your  team of health care providers and others.  The following information explains what you need to know to manage your diabetes at home. What do I need to do to manage my blood glucose?  Check your blood glucose every day, as often as told by your health care provider.  Contact your health care provider if your blood glucose is above your target for 2 tests in a row.  Have your A1c (hemoglobin A1c) level checked at least two times a year, or as often as told by your health care provider. Your health care provider will set individualized treatment goals for you. Generally, the goal of treatment is to maintain the following blood glucose levels:  Before meals (preprandial): 80-130 mg/dL (4.4-7.2 mmol/L).  After meals (postprandial): below 180 mg/dL (10 mmol/L).  A1c level: less than 7%.  What do I need to know about hyperglycemia and hypoglycemia? What is hyperglycemia? Hyperglycemia, also called high blood glucose, occurs when blood glucose is too high.Make sure you know the early signs of  hyperglycemia, such as:  Increased thirst.  Hunger.  Feeling very tired.  Needing to urinate more often than usual.  Blurry vision.  What is hypoglycemia? Hypoglycemia, also called low blood glucose, occurswith a blood glucose level at or below 70 mg/dL (3.9 mmol/L). The risk for hypoglycemia increases during or after exercise, during sleep, during illness, and when skipping meals or not eating for a long time (fasting). It is important to know the symptoms of hypoglycemia and treat it right away. Always have a 15-gram rapid-acting carbohydrate snack with you to treat low blood glucose. Family members and close friends should also know the symptoms and should understand how to treat hypoglycemia, in case you are not able to treat yourself. What are the symptoms of hypoglycemia? Hypoglycemia symptoms can include:  Hunger.  Anxiety.  Sweating and feeling clammy.  Confusion.  Dizziness or  feeling light-headed.  Sleepiness.  Nausea.  Increased heart rate.  Headache.  Blurry vision.  Seizure.  Nightmares.  Tingling or numbness around the mouth, lips, or tongue.  A change in speech.  Decreased ability to concentrate.  A change in coordination.  Restless sleep.  Tremors or shakes.  Fainting.  Irritability.  How do I treat hypoglycemia?  If you are alert and able to swallow safely, follow the 15:15 rule:  Take 15 grams of a rapid-acting carbohydrate. Rapid-acting options include: ? 1 tube of glucose gel. ? 3 glucose pills. ? 6-8 pieces of hard candy. ? 4 oz (120 mL) of fruit juice. ? 4 oz (120 mL) of regular (not diet) soda.  Check your blood glucose 15 minutes after you take the carbohydrate.  If the repeat blood glucose level is still at or below 70 mg/dL (3.9 mmol/L), take 15 grams of a carbohydrate again.  If your blood glucose level does not increase above 70 mg/dL (3.9 mmol/L) after 3 tries, seek emergency medical care.  After your blood glucose level returns to normal, eat a meal or a snack within 1 hour.  How do I treat severe hypoglycemia? Severe hypoglycemia is when your blood glucose level is at or below 54 mg/dL (3 mmol/L). Severe hypoglycemia is an emergency. Do not wait to see if the symptoms will go away. Get medical help right away. Call your local emergency services (911 in the [place] Do not drive yourself to the hospital. If you have severe hypoglycemia and you cannot eat or drink, you may need an injection of glucagon. A family member or close friend should learn how to check your blood glucose and how to give you a glucagon injection. Ask your health care provider if you need to have an emergency glucagon injection kit available. Severe hypoglycemia may need to be treated in a hospital. The treatment may include getting glucose through an IV tube. You may also need treatment for the cause of your hypoglycemia. Can having diabetes  put me at risk for other conditions? Having diabetes can put you at risk for other long-term (chronic) conditions, such as heart disease and kidney disease. Your health care provider may prescribe medicines to help prevent complications from diabetes. These medicines may include:  Aspirin.  Medicine to lower cholesterol.  Medicine to control blood pressure.  What else can I do to manage my diabetes? Take your diabetes medicines as told  If your health care provider prescribed insulin or diabetes medicines, take them every day.  Do not run out of insulin or other diabetes medicines that you take. Plan ahead so you always  have these available.  If you use insulin, adjust your dosage based on how physically active you are and what foods you eat. Your health care provider will tell you how to adjust your dosage. Make healthy food choices  The things that you eat and drink affect your blood glucose and your insulin dosage. Making good choices helps to control your diabetes and prevent other health problems. A healthy meal plan includes eating lean proteins, complex carbohydrates, fresh fruits and vegetables, low-fat dairy products, and healthy fats. Make an appointment to see a diet and nutrition specialist (registered dietitian) to help you create an eating plan that is right for you. Make sure that you:  Follow instructions from your health care provider about eating or drinking restrictions.  Drink enough fluid to keep your urine clear or pale yellow.  Eat healthy snacks between nutritious meals.  Track the carbohydrates that you eat. Do this by reading food labels and learning the standard serving sizes of foods.  Follow your sick day plan whenever you cannot eat or drink as usual. Make this plan in advance with your health care provider.  Stay active  Exercise regularly, as told by your health care provider. This may include:  Stretching and doing strength exercises, such as yoga  or weightlifting, at least 2 times a week.  Doing at least 150 minutes of moderate-intensity or vigorous-intensity exercise each week. This could be brisk walking, biking, or water aerobics. ? Spread out your activity over at least 3 days of the week. ? Do not go more than 2 days in a row without doing some kind of physical activity.  When you start a new exercise or activity, work with your health care provider to adjust your insulin, medicines, or food intake as needed. Make healthy lifestyle choices  Do not use any tobacco products, such as cigarettes, chewing tobacco, and e-cigarettes. If you need help quitting, ask your health care provider.  If your health care provider says that alcohol is safe for you, limit alcohol intake to no more than 1 drink per day for nonpregnant women and 2 drinks per day for men. One drink equals 12 oz of beer, 5 oz of wine, or 1 oz of hard liquor.  Learn to manage stress. If you need help with this, ask your health care provider. Care for your body   Keep your immunizations up to date. In addition to getting vaccinations as told by your health care provider, it is recommended that you get vaccinated against the following illnesses: ? The flu (influenza). Get a flu shot every year. ? Pneumonia. ? Hepatitis B.  Schedule an eye exam soon after your diagnosis, and then one time every year after that.  Check your skin and feet every day for cuts, bruises, redness, blisters, or sores. Schedule a foot exam with your health care provider once every year.  Brush your teeth and gums two times a day, and floss at least one time a day. Visit your dentist at least once every 6 months.  Maintain a healthy weight. General instructions  Take over-the-counter and prescription medicines only as told by your health care provider.  Share your diabetes management plan with people in your workplace, school, and household.  Check your urine for ketones when you are ill  and as told by your health care provider.  Ask your health care provider: ? Do I need to meet with a diabetes educator? ? Where can I find a support group for  people with diabetes?  Carry a medical alert card or wear medical alert jewelry.  Keep all follow-up visits as told by your health care provider. This is important. Where to find more information: For more information about diabetes, visit:  American Diabetes Association (ADA): www.diabetes.org  American Association of Diabetes Educators (AADE): www.diabeteseducator.org/patient-resources  This information is not intended to replace advice given to you by your health care provider. Make sure you discuss any questions you have with your health care provider. Document Released: 02/07/2016 Document Revised: 03/23/2016 Document Reviewed: 11/19/2015 Elsevier Interactive Patient Education  2017 [employer].      Blood Glucose Monitoring, Adult Monitoring your blood sugar (glucose) helps you manage your diabetes. It also helps you and your health care provider determine how well your diabetes management plan is working. Blood glucose monitoring involves checking your blood glucose as often as directed, and keeping a record (log) of your results over time. Why should I monitor my blood glucose? Checking your blood glucose regularly can:  Help you understand how food, exercise, illnesses, and medicines affect your blood glucose.  Let you know what your blood glucose is at any time. You can quickly tell if you are having low blood glucose (hypoglycemia) or high blood glucose (hyperglycemia).  Help you and your health care provider adjust your medicines as needed.  When should I check my blood glucose? Follow instructions from your health care provider about how often to check your blood glucose.   This may depend on:  The type of diabetes you have.  How well-controlled your diabetes is.  Medicines you are taking.  If you have  type 1 diabetes:  Check your blood glucose at least 2 times a day.  Also check your blood glucose: ? Before every insulin injection. ? Before and after exercise. ? Between meals. ? 2 hours after a meal. ? Occasionally between 2:00 a.m. and 3:00 a.m., as directed. ? Before potentially dangerous tasks, like driving or using heavy machinery. ? At bedtime.  You may need to check your blood glucose more often, up to 6-10 times a day: ? If you use an insulin pump. ? If you need multiple daily injections (MDI). ? If your diabetes is not well-controlled. ? If you are ill. ? If you have a history of severe hypoglycemia. ? If you have a history of not knowing when your blood glucose is getting low (hypoglycemia unawareness).  If you have type 2 diabetes:  If you take insulin or other diabetes medicines, check your blood glucose at least 2 times a day.  If you are on intensive insulin therapy, check your blood glucose at least 4 times a day. Occasionally, you may also need to check between 2:00 a.m. and 3:00 a.m., as directed.  Also check your blood glucose: ? Before and after exercise. ? Before potentially dangerous tasks, like driving or using heavy machinery.  You may need to check your blood glucose more often if: ? Your medicine is being adjusted. ? Your diabetes is not well-controlled. ? You are ill.  What is a blood glucose log?  A blood glucose log is a record of your blood glucose readings. It helps you and your health care provider: ? Look for patterns in your blood glucose over time. ? Adjust your diabetes management plan as needed.  Every time you check your blood glucose, write down your result and notes about things that may be affecting your blood glucose, such as your diet and exercise for  the day.  Most glucose meters store a record of glucose readings in the meter. Some meters allow you to download your records to a computer. How do I check my blood  glucose? Follow these steps to get accurate readings of your blood glucose: Supplies needed   Blood glucose meter.  Test strips for your meter. Each meter has its own strips. You must use the strips that come with your meter.  A needle to prick your finger (lancet). Do not use lancets more than once.  A device that holds the lancet (lancing device).  A journal or log book to write down your results.  Procedure  Wash your hands with soap and water.  Prick the side of your finger (not the tip) with the lancet. Use a different finger each time.  Gently rub the finger until a small drop of blood appears.  Follow instructions that come with your meter for inserting the test strip, applying blood to the strip, and using your blood glucose meter.  Write down your result and any notes.  Alternative testing sites  Some meters allow you to use areas of your body other than your finger (alternative sites) to test your blood.  If you think you may have hypoglycemia, or if you have hypoglycemia unawareness, do not use alternative sites. Use your finger instead.  Alternative sites may not be as accurate as the fingers, because blood flow is slower in these areas. This means that the result you get may be delayed, and it may be different from the result that you would get from your finger.  The most common alternative sites are: ? Forearm. ? Thigh. ? Palm of the hand.  Additional tips  Always keep your supplies with you.  If you have questions or need help, all blood glucose meters have a 24-hour "hotline" number that you can call. You may also contact your health care provider.  After you use a few boxes of test strips, adjust (calibrate) your blood glucose meter by following instructions that came with your meter.    The American Diabetes Association suggests the following targets for most nonpregnant adults with diabetes.  More or less stringent glycemic goals may be appropriate  for each individual.  A1C: Less than 7% A1C may also be reported as eAG: Less than 154 mg/dl Before a meal (preprandial plasma glucose): 80-130 mg/dl 1-2 hours after beginning of the meal (Postprandial plasma glucose)*: Less than 180 mg/dl  *Postprandial glucose may be targeted if A1C goals are not met despite reaching preprandial glucose goals.   GOALS in short:  The goals are for the Hgb A1C to be less than 7.0 & blood pressure to be less than 130/80.    It is recommended that all diabetics are educated on and follow a healthy diabetic diet, exercise for 30 minutes 3-4 times per week (walking, biking, swimming, or machine), monitor blood glucose readings and bring that record with you to be reviewed at your next office visit.     You should be checking fasting blood sugars- especially after you eat poorly or eat really healthy, and also check 2 hour postprandial blood sugars after largest meal of the day.    Write these down and bring in your log at each office visit.    You will need to be seen every 3 months by the provider managing your Diabetes unless told otherwise by that provider.   You will need yearly eye exams from an eye specialist and  foot exams to check the nerves of your feet.  Also, your urine should be checked yearly as well to make sure excess protein is not present.   If you are checking your blood pressure at home, please record it and bring it to your next office visit.    Follow the Dietary Approaches to Stop Hypertension (DASH) diet (3 servings of fruit and vegetables daily, whole grains, low sodium, low-fat proteins).  See below.    Lastly, when it comes to your cholesterol, the goal is to have the HDL (good cholesterol) >40, and the LDL (bad cholesterol) <100.   It is recommended that you follow a heart healthy, low saturated and trans-fat diet and exercise for 30 minutes at least 5 times a week.     (( Check out the DASH diet = 1.5 Gram Low Sodium Diet   A 1.5  gram sodium diet restricts the amount of sodium in the diet to no more than 1.5 g or 1500 mg daily.  The American Heart Association recommends Americans over the age of 66 to consume no more than 1500 mg of sodium each day to reduce the risk of developing high blood pressure.  Research also shows that limiting sodium may reduce heart attack and stroke risk.  Many foods contain sodium for flavor and sometimes as a preservative.  When the amount of sodium in a diet needs to be low, it is important to know what to look for when choosing foods and drinks.  The following includes some information and guidelines to help make it easier for you to adapt to a low sodium diet.    QUICK TIPS  Do not add salt to food.  Avoid convenience items and fast food.  Choose unsalted snack foods.  Buy lower sodium products, often labeled as "lower sodium" or "no salt added."  Check food labels to learn how much sodium is in 1 serving.  When eating at a restaurant, ask that your food be prepared with less salt or none, if possible.    READING FOOD LABELS FOR SODIUM INFORMATION  The nutrition facts label is a good place to find how much sodium is in foods. Look for products with no more than 400 mg of sodium per serving.  Remember that 1.5 g = 1500 mg.  The food label may also list foods as:  Sodium-free: Less than 5 mg in a serving.  Very low sodium: 35 mg or less in a serving.  Low-sodium: 140 mg or less in a serving.  Light in sodium: 50% less sodium in a serving. For example, if a food that usually has 300 mg of sodium is changed to become light in sodium, it will have 150 mg of sodium.  Reduced sodium: 25% less sodium in a serving. For example, if a food that usually has 400 mg of sodium is changed to reduced sodium, it will have 300 mg of sodium.    CHOOSING FOODS  Grains  Avoid: Salted crackers and snack items. Some cereals, including instant hot cereals. Bread stuffing and biscuit mixes. Seasoned rice or  pasta mixes.  Choose: Unsalted snack items. Low-sodium cereals, oats, puffed wheat and rice, shredded wheat. English muffins and bread. Pasta.  Meats  Avoid: Salted, canned, smoked, spiced, pickled meats, including fish and poultry. Bacon, ham, sausage, cold cuts, hot dogs, anchovies.  Choose: Low-sodium canned tuna and salmon. Fresh or frozen meat, poultry, and fish.  Dairy  Avoid: Processed cheese and spreads. Cottage cheese. Buttermilk  and condensed milk. Regular cheese.  Choose: Milk. Low-sodium cottage cheese. Yogurt. Sour cream. Low-sodium cheese.  Fruits and Vegetables  Avoid: Regular canned vegetables. Regular canned tomato sauce and paste. Frozen vegetables in sauces. Olives. Angie Fava. Relishes. Sauerkraut.  Choose: Low-sodium canned vegetables. Low-sodium tomato sauce and paste. Frozen or fresh vegetables. Fresh and frozen fruit.  Condiments  Avoid: Canned and packaged gravies. Worcestershire sauce. Tartar sauce. Barbecue sauce. Soy sauce. Steak sauce. Ketchup. Onion, garlic, and table salt. Meat flavorings and tenderizers.  Choose: Fresh and dried herbs and spices. Low-sodium varieties of mustard and ketchup. Lemon juice. Tabasco sauce. Horseradish.    SAMPLE 1.5 GRAM SODIUM MEAL PLAN:   Breakfast / Sodium (mg)  1 cup low-fat milk / 143 mg  1 whole-wheat English muffin / 240 mg  1 tbs heart-healthy margarine / 153 mg  1 hard-boiled egg / 139 mg  1 small orange / 0 mg  Lunch / Sodium (mg)  1 cup raw carrots / 76 mg  2 tbs no salt added peanut butter / 5 mg  2 slices whole-wheat bread / 270 mg  1 tbs jelly / 6 mg   cup red grapes / 2 mg  Dinner / Sodium (mg)  1 cup whole-wheat pasta / 2 mg  1 cup low-sodium tomato sauce / 73 mg  3 oz lean ground beef / 57 mg  1 small side salad (1 cup raw spinach leaves,  cup cucumber,  cup yellow bell pepper) with 1 tsp olive oil and 1 tsp red wine vinegar / 25 mg  Snack / Sodium (mg)  1 container low-fat vanilla yogurt / 107 mg  3  graham cracker squares / 127 mg  Nutrient Analysis  Calories: 1745  Protein: 75 g  Carbohydrate: 237 g  Fat: 57 g  Sodium: 1425 mg  Document Released: 10/16/2005 Document Revised: 06/28/2011 Document Reviewed: 01/17/2010  ExitCare Patient Information 2012 [place].))    This information is not intended to replace advice given to you by your health care provider. Make sure you discuss any questions you have with your health care provider. Document Released: 10/19/2003 Document Revised: 05/05/2016 Document Reviewed: 03/27/2016 Elsevier Interactive Patient Education  2017 [employer].

## 2018-04-18 ENCOUNTER — Encounter: Payer: Self-pay | Admitting: Family Medicine

## 2018-04-18 ENCOUNTER — Ambulatory Visit (INDEPENDENT_AMBULATORY_CARE_PROVIDER_SITE_OTHER): Payer: BC Managed Care – PPO | Admitting: Family Medicine

## 2018-04-18 VITALS — BP 130/87 | HR 73 | Ht 76.0 in | Wt 257.0 lb

## 2018-04-18 DIAGNOSIS — E669 Obesity, unspecified: Secondary | ICD-10-CM | POA: Diagnosis not present

## 2018-04-18 DIAGNOSIS — I10 Essential (primary) hypertension: Secondary | ICD-10-CM | POA: Diagnosis not present

## 2018-04-18 DIAGNOSIS — Z79899 Other long term (current) drug therapy: Secondary | ICD-10-CM | POA: Diagnosis not present

## 2018-04-18 DIAGNOSIS — E1159 Type 2 diabetes mellitus with other circulatory complications: Secondary | ICD-10-CM

## 2018-04-18 DIAGNOSIS — E10649 Type 1 diabetes mellitus with hypoglycemia without coma: Secondary | ICD-10-CM

## 2018-04-18 DIAGNOSIS — Z719 Counseling, unspecified: Secondary | ICD-10-CM

## 2018-04-18 LAB — POCT GLYCOSYLATED HEMOGLOBIN (HGB A1C): HEMOGLOBIN A1C: 6.4 % — AB (ref 4.0–5.6)

## 2018-04-18 MED ORDER — LOSARTAN POTASSIUM 50 MG PO TABS: 50.0000 mg | ORAL_TABLET | Freq: Every day | ORAL | 3 refills | Status: DC

## 2018-04-18 NOTE — Patient Instructions (Addendum)
-We will start losartan   Lantus dose 25 units at bedtime.   +++ However please know if you start the keto diet, I highly recommend you log everything you eat or put in your mouth into an app such as lose it or my fitness pal  so you can see exactly how many carbs you are eating.   ----->  Please understand you will have to significantly dial back your short acting insulin per meal if you are not eating many carbs, as well as your Lantus dose at bedtime if you do a keto diet!!!  Please add Humalog/ short acting insulin: - 10 units before a smaller meal - 15 units before a larger meal  Add the following Sliding Scale: - 150-175: + 1 unit  - 176-200: + 2 units  - 201-225: + 3 units  - 226-250: + 4 units  - 251-275: + 5 units - 276-300: + 6 units  Please return in 1.5 months with your sugar log.    Basic Rules for Patients with Type I Diabetes Mellitus  1. The American Diabetes Association (ADA) recommended targets: - fasting sugar <130 - after meal sugar <180 - HbA1C <7%  2. Engage in ?150 min moderate exercise per week  3. Make sure you have ?8h of sleep every night as this helps both blood sugars and your weight.  4. Always keep a sugar log (not only record in your meter) and bring it to all appointments with [place].  5. "15-15 rule" for hypoglycemia: if sugars are low, take 15 g of carbs** ("fast sugar" - e.g. 4 glucose tablets, 4 oz orange juice), wait 15 min, then check sugars again. If still <80, repeat. Continue  until your sugars >80, then eat a normal meal.   6. Teach family members and coworkers to inject glucagon. Have a glucagon set at home and one at work. They should call 911 after using the set.  7. Check sugar before driving. If <100, correct, and only start driving if sugars rise ?756. Check sugar every hour when on a long drive.  8. Check sugar before exercising. If <100, correct, and only start exercising if sugars rise ?100. Check sugar every hour  when on a long exercise routine and 1h after you finished exercising.   If >250, check urine for ketones. If you have moderate-large ketones in urine, do not start exercise. Hydrate yourself with clear liquids and correct the high sugar. Recheck sugars and ketones before attempting to exercise.  Be aware that you might need less insulin when exercising.  *intense, short, exercise bursts can increase your sugars, but  *less intense, longer (>1h), exercise routines can decrease your sugars.   9. Make sure you have a MedAlert bracelet or pendant mentioning "Type I Diabetes Mellitus". If you have a prior episode of severe hypoglycemia or hypoglycemia unawareness, it should also mention this.  10. Please do not walk barefoot. Inspect your feet for sores/cuts and let us know if you have them.  11. Please call [facility] with any questions and concerns [phone]).   **E.g. of "fast carbs":  first choice (15 g):  1 tube glucose gel, GlucoPouch 15, 2 oz glucose liquid  second choice (15-16 g):  3 or 4 glucose tablets (best taken      with water), 15 Dextrose Bits chewable  third choice (15-20 g):   cup fruit juice,  cup regular soda, 1 cup skim milk,  1 cup sports drink  fourth choice (15-20  g):  1 small tube [place] gel (not frosting), 2 tbsp raisins, 1 tbsp table sugar,  candy, jelly beans, gum drops - check package for carb amount   (adapted from: Juluis Rainier. "Insulin therapy and hypoglycemia" Endocrinol Metab Clin N Am 2012, 41: 57-87)   Hypertension Hypertension, commonly called high blood pressure, is when the force of blood pumping through the arteries is too strong. The arteries are the blood vessels that carry blood from the heart throughout the body. Hypertension forces the heart to work harder to pump blood and may cause arteries to become narrow or stiff. Having untreated or uncontrolled hypertension can cause heart attacks, strokes, kidney  disease, and other problems. A blood pressure reading consists of a higher number over a lower number. Ideally, your blood pressure should be below 120/80. The first ("top") number is called the systolic pressure. It is a measure of the pressure in your arteries as your heart beats. The second ("bottom") number is called the diastolic pressure. It is a measure of the pressure in your arteries as the heart relaxes. What are the causes? The cause of this condition is not known. What increases the risk? Some risk factors for high blood pressure are under your control. Others are not. Factors you can change  Smoking.  Having type 2 diabetes mellitus, high cholesterol, or both.  Not getting enough exercise or physical activity.  Being overweight.  Having too much fat, sugar, calories, or salt (sodium) in your diet.  Drinking too much alcohol. Factors that are difficult or impossible to change  Having chronic kidney disease.  Having a family history of high blood pressure.  Age. Risk increases with age.  Race. You may be at higher risk if you are African-American.  Gender. Men are at higher risk than women before age 76. After age 43, women are at higher risk than men.  Having obstructive sleep apnea.  Stress. What are the signs or symptoms? Extremely high blood pressure (hypertensive crisis) may cause:  Headache.  Anxiety.  Shortness of breath.  Nosebleed.  Nausea and vomiting.  Severe chest pain.  Jerky movements you cannot control (seizures).  How is this diagnosed? This condition is diagnosed by measuring your blood pressure while you are seated, with your arm resting on a surface. The cuff of the blood pressure monitor will be placed directly against the skin of your upper arm at the level of your heart. It should be measured at least twice using the same arm. Certain conditions can cause a difference in blood pressure between your right and left arms. Certain  factors can cause blood pressure readings to be lower or higher than normal (elevated) for a short period of time:  When your blood pressure is higher when you are in a health care provider's office than when you are at home, this is called white coat hypertension. Most people with this condition do not need medicines.  When your blood pressure is higher at home than when you are in a health care provider's office, this is called masked hypertension. Most people with this condition may need medicines to control blood pressure.  If you have a high blood pressure reading during one visit or you have normal blood pressure with other risk factors:  You may be asked to return on a different day to have your blood pressure checked again.  You may be asked to monitor your blood pressure at home for 1 week or longer.  If you are diagnosed  with hypertension, you may have other blood or imaging tests to help your health care provider understand your overall risk for other conditions. How is this treated? This condition is treated by making healthy lifestyle changes, such as eating healthy foods, exercising more, and reducing your alcohol intake. Your health care provider may prescribe medicine if lifestyle changes are not enough to get your blood pressure under control, and if:  Your systolic blood pressure is above 130.  Your diastolic blood pressure is above 80.  Your personal target blood pressure may vary depending on your medical conditions, your age, and other factors. Follow these instructions at home: Eating and drinking  Eat a diet that is high in fiber and potassium, and low in sodium, added sugar, and fat. An example eating plan is called the DASH (Dietary Approaches to Stop Hypertension) diet. To eat this way: ? Eat plenty of fresh fruits and vegetables. Try to fill half of your plate at each meal with fruits and vegetables. ? Eat whole grains, such as whole wheat pasta, brown rice, or  whole grain bread. Fill about one quarter of your plate with whole grains. ? Eat or drink low-fat dairy products, such as skim milk or low-fat yogurt. ? Avoid fatty cuts of meat, processed or cured meats, and poultry with skin. Fill about one quarter of your plate with lean proteins, such as fish, chicken without skin, beans, eggs, and tofu. ? Avoid premade and processed foods. These tend to be higher in sodium, added sugar, and fat.  Reduce your daily sodium intake. Most people with hypertension should eat less than 1,500 mg of sodium a day.  Limit alcohol intake to no more than 1 drink a day for nonpregnant women and 2 drinks a day for men. One drink equals 12 oz of beer, 5 oz of wine, or 1 oz of hard liquor. Lifestyle  Work with your health care provider to maintain a healthy body weight or to lose weight. Ask what an ideal weight is for you.  Get at least 30 minutes of exercise that causes your heart to beat faster (aerobic exercise) most days of the week. Activities may include walking, swimming, or biking.  Include exercise to strengthen your muscles (resistance exercise), such as pilates or lifting weights, as part of your weekly exercise routine. Try to do these types of exercises for 30 minutes at least 3 days a week.  Do not use any products that contain nicotine or tobacco, such as cigarettes and e-cigarettes. If you need help quitting, ask your health care provider.  Monitor your blood pressure at home as told by your health care provider.  Keep all follow-up visits as told by your health care provider. This is important. Medicines  Take over-the-counter and prescription medicines only as told by your health care provider. Follow directions carefully. Blood pressure medicines must be taken as prescribed.  Do not skip doses of blood pressure medicine. Doing this puts you at risk for problems and can make the medicine less effective.  Ask your health care provider about side  effects or reactions to medicines that you should watch for. Contact a health care provider if:  You think you are having a reaction to a medicine you are taking.  You have headaches that keep coming back (recurring).  You feel dizzy.  You have swelling in your ankles.  You have trouble with your vision. Get help right away if:  You develop a severe headache or confusion.  You  have unusual weakness or numbness.  You feel faint.  You have severe pain in your chest or abdomen.  You vomit repeatedly.  You have trouble breathing. Summary  Hypertension is when the force of blood pumping through your arteries is too strong. If this condition is not controlled, it may put you at risk for serious complications.  Your personal target blood pressure may vary depending on your medical conditions, your age, and other factors. For most people, a normal blood pressure is less than 120/80.  Hypertension is treated with lifestyle changes, medicines, or a combination of both. Lifestyle changes include weight loss, eating a healthy, low-sodium diet, exercising more, and limiting alcohol. This information is not intended to replace advice given to you by your health care provider. Make sure you discuss any questions you have with your health care provider. Document Released: 10/16/2005 Document Revised: 09/13/2016 Document Reviewed: 09/13/2016 Elsevier Interactive Patient Education  2018 [employer]     How to Take Your Blood Pressure Blood pressure is a measurement of how strongly your blood is pressing against the walls of your arteries. Arteries are blood vessels that carry blood from your heart throughout your body. Your health care provider takes your blood pressure at each office visit. You can also take your own blood pressure at home with a blood pressure machine. You may need to take your own blood pressure:  To confirm a diagnosis of high blood pressure (hypertension).  To  monitor your blood pressure over time.  To make sure your blood pressure medicine is working.  Supplies needed: To take your blood pressure, you will need a blood pressure machine. You can buy a blood pressure machine, or blood pressure monitor, at most drugstores or online. There are several types of home blood pressure monitors. When choosing one, consider the following:  Choose a monitor that has an arm cuff.  Choose a monitor that wraps snugly around your upper arm. You should be able to fit only one finger between your arm and the cuff.  Do not choose a monitor that measures your blood pressure from your wrist or finger.  Your health care provider can suggest a reliable monitor that will meet your needs. How to prepare To get the most accurate reading, avoid the following for 30 minutes before you check your blood pressure:  Drinking caffeine.  Drinking alcohol.  Eating.  Smoking.  Exercising.  Five minutes before you check your blood pressure:  Empty your bladder.  Sit quietly without talking in a dining chair, rather than in a soft couch or armchair.  How to take your blood pressure To check your blood pressure, follow the instructions in the manual that came with your blood pressure monitor. If you have a digital blood pressure monitor, the instructions may be as follows: 1. Sit up straight. 2. Place your feet on the floor. Do not cross your ankles or legs. 3. Rest your left arm at the level of your heart on a table or desk or on the arm of a chair. 4. Pull up your shirt sleeve. 5. Wrap the blood pressure cuff around the upper part of your left arm, 1 inch (2.5 cm) above your elbow. It is best to wrap the cuff around bare skin. 6. Fit the cuff snugly around your arm. You should be able to place only one finger between the cuff and your arm. 7. Position the cord inside the groove of your elbow. 8. Press the power button. 9. Sit  quietly while the cuff inflates and  deflates. 10. Read the digital reading on the monitor screen and write it down (record it). 11. Wait 2-3 minutes, then repeat the steps, starting at step 1.  What does my blood pressure reading mean? A blood pressure reading consists of a higher number over a lower number. Ideally, your blood pressure should be below 120/80. The first ("top") number is called the systolic pressure. It is a measure of the pressure in your arteries as your heart beats. The second ("bottom") number is called the diastolic pressure. It is a measure of the pressure in your arteries as the heart relaxes. Blood pressure is classified into four stages. The following are the stages for adults who do not have a short-term serious illness or a chronic condition. Systolic pressure and diastolic pressure are measured in a unit called mm Hg. Normal  Systolic pressure: below 120.  Diastolic pressure: below 80. Elevated  Systolic pressure: 120-129.  Diastolic pressure: below 80. Hypertension stage 1  Systolic pressure: 130-139.  Diastolic pressure: 80-89. Hypertension stage 2  Systolic pressure: 140 or above.  Diastolic pressure: 90 or above. You can have prehypertension or hypertension even if only the systolic or only the diastolic number in your reading is higher than normal. Follow these instructions at home:  Check your blood pressure as often as recommended by your health care provider.  Take your monitor to the next appointment with your health care provider to make sure: ? That you are using it correctly. ? That it provides accurate readings.  Be sure you understand what your goal blood pressure numbers are.  Tell your health care provider if you are having any side effects from blood pressure medicine. Contact a health care provider if:  Your blood pressure is consistently high. Get help right away if:  Your systolic blood pressure is higher than 180.  Your diastolic blood pressure is higher  than 110. This information is not intended to replace advice given to you by your health care provider. Make sure you discuss any questions you have with your health care provider. Document Released: 03/24/2016 Document Revised: 06/06/2016 Document Reviewed: 03/24/2016 Elsevier Interactive Patient Education  [employer].

## 2018-04-18 NOTE — Progress Notes (Signed)
Impression and Recommendations:    1. Uncontrolled type 1 diabetes mellitus with hypoglycemia without coma (HCC)   2. Hypertension associated with diabetes (HCC)   3. Obesity, Class I, BMI 30-34.9   4. High risk medications (not anticoagulants) long-term use   5. Health education/counseling      1. Type 1 DM, stable -Patient Hgb A1c at 6.4 today, 04/18/2018, which is vastly improved from 9.4 on last visit, 01/09/2018.   -Discussed that due to the recent diet changes with the patient wanting to start on keto diets, then he will need to modify his insulin that he uses to prevent hypoglycemia.   -Advised to continue medications.   -Also discussed with the patient regarding precautions to prevent hypoglycemia today.   2. HTN associated with DM -Blood pressure today at 147/76.   -Recheck of blood pressure is at 132/94.   -discussed with patient regarding maintaining blood pressure medication of 130/80 or better.   -Lifestyle changes such as dash diet and engaging in a regular exercise program discussed with patient.  Educational handouts provided  -Ambulatory BP monitoring encouraged. Keep log and bring in next OV   3. Obesity, class 1 -Advised the use of the "Lose It" app to aid with logging food and checking carb counts.   -American Heart Association guidelines for healthy diet, basically Mediterranean diet, and exercise guidelines of 30 minutes 5 days per week or more discussed in detail.  -Health counseling performed.  All questions answered.      Education and routine counseling performed. Handouts provided.  Orders Placed This Encounter  Procedures  . POCT glycosylated hemoglobin (Hb A1C)    Meds ordered this encounter  Medications  . losartan (COZAAR) 50 MG tablet    Sig: Take 1 tablet (50 mg total) by mouth daily.    Dispense:  90 tablet    Refill:  3    Return in about 3 months (around 07/19/2018) for a1c check, startesd losartan-bring in BP  log.   The patient was counseled, risk factors were discussed, anticipatory guidance given.  Gross side effects, risk and benefits, and alternatives of medications discussed with patient.  Patient is aware that all medications have potential side effects and we are unable to predict every side effect or drug-drug interaction that may occur.  Expresses verbal understanding and consents to current therapy plan and treatment regimen.  Please see AVS handed out to patient at the end of our visit for further patient instructions/ counseling done pertaining to today's office visit.    Note: This document was prepared using Dragon voice recognition software and may include unintentional dictation errors.  I have reviewed the above documentation for accuracy and completeness, and I agree with the above.   Thomasene LotDeborah Meloney Feld 04/18/18 5:06 PM    Subjective:    Chief Complaint  Patient presents with  . Follow-up     Cristian Kennedy is a 22 y.o. male who [facility] today for Diabetes Management.     DM HPI: -  He has been working on diet and exercise for diabetes  Pt is currently maintained on the following medications for diabetes:   see med list today Medication compliance - he has been continuing with his medication as prescribed.   Home glucose readings range 198 with his lowest reading at 36 and he quickly ate cookies with a diet mountain dew. He has brought in his blood sugar levels today. He  typically uses 30 units of lantus nightly.   Discussed that is his sugar levels are within range and he hasn't consumed carbs, then don't consume insulin unless indicated.    Denies polyuria/polydipsia. Denies hypo/ hyperglycemia symptoms - He denies new onset of: chest pain, exercise intolerance, shortness of breath, dizziness, visual changes, headache, lower extremity swelling or claudication.   Last diabetic eye exam was No results found for:  HMDIABEYEEXA  Foot exam- UTD  Last A1C in the office was:  Lab Results  Component Value Date   HGBA1C 6.4 (A) 04/18/2018   HGBA1C 9.4 01/09/2018   HGBA1C >14.0 06/09/2016    Lab Results  Component Value Date   MICROALBUR 10 01/09/2018   LDLCALC NOT CALC 06/09/2016   CREATININE 1.06 06/09/2016     1. HTN HPI:  -  His blood pressure has been controlled at home.  Pt is not checking it at home, because he doesn't have an at home machine. He has ben exercising a lot more at home to aid with his blood pressure levels.   - Patient reports good compliance with blood pressure medications  - Denies medication S-E   - Smoking Status noted   - He denies new onset of: chest pain, exercise intolerance, shortness of breath, dizziness, visual changes, headache, lower extremity swelling or claudication.    Filed Weights   04/18/18 1503  Weight: 257 lb (116.6 kg)      Last 3 blood pressure readings in our office are as follows: BP Readings from Last 3 Encounters:  04/18/18 130/87  02/21/18 132/88  01/09/18 136/84    BMI Readings from Last 3 Encounters:  04/18/18 31.28 kg/m  02/21/18 31.93 kg/m  01/09/18 31.28 kg/m     No problems updated.    Patient Care Team    Relationship Specialty Notifications Start End  Thomasene Lot, DO PCP - General Family Medicine  2016-06-19      Patient Active Problem List   Diagnosis Date Noted  . Hypothyroidism 05/07/2017    Priority: High  . Obesity, Class I, BMI 30-34.9 05/07/2017    Priority: High  . Type I diabetes mellitus, uncontrolled (HCC) 06/13/2016    Priority: High  . Hypertriglyceridemia, familial 06/13/2016    Priority: High  . Family history of early CAD- father died AMI age 79 06-19-2016    Priority: High  . Elevated TSH 06/25/2016    Priority: Medium  . Vitamin D insufficiency 06/25/2016    Priority: Low  . Borderline hypertension 02/21/2018  . High risk medications (not anticoagulants) long-term use  02/21/2018  . Family history of essential hypertension 2016/06/19  . Family history of diabetes mellitus (DM) Jun 19, 2016     Past Medical History:  Diagnosis Date  . Family history of diabetes mellitus (DM) 2016-06-19  . Family history of early CAD- father died AMI age 29 06-19-2016  . Family history of essential hypertension 06/19/16  . Overweight (BMI 25.0-29.9) 06-19-16     Past Surgical History:  Procedure Laterality Date  . TONSILLECTOMY AND ADENOIDECTOMY    . TYMPANOSTOMY TUBE PLACEMENT       Family History  Problem Relation Age of Onset  . Cancer Mother        leukemia  . Hypertension Mother   . Alcohol abuse Father   . Stroke Father   . Alcohol abuse Paternal Uncle   . Diabetes Maternal Grandmother   . Hypertension Maternal Grandmother   . Cancer Paternal Grandmother  stomach/liver  . Diabetes Paternal Grandmother   . Hypertension Paternal Grandmother   . Alcohol abuse Paternal Grandfather      Social History   Substance and Sexual Activity  Drug Use No  ,  Social History   Substance and Sexual Activity  Alcohol Use No  ,  Social History   Tobacco Use  Smoking Status Never Smoker  Smokeless Tobacco Never Used  ,    Current Outpatient Medications on File Prior to Visit  Medication Sig Dispense Refill  . Cholecalciferol (VITAMIN D3) 5000 units TABS 5,000 IU OTC vitamin D3 daily. 90 tablet 3  . Insulin Glargine (LANTUS SOLOSTAR) 100 UNIT/ML Solostar Pen Inject 30 Units into the skin at bedtime. Please include QS 3 months. 5 pen 2  . levothyroxine (SYNTHROID, LEVOTHROID) 50 MCG tablet Take 50 mcg by mouth daily before breakfast.    . NOVOLOG FLEXPEN 100 UNIT/ML FlexPen 10-20 units 3 times daily with meals 15 mL 2   No current facility-administered medications on file prior to visit.      No Known Allergies   Review of Systems:   General:  Denies fever, chills Optho/Auditory:   Denies visual changes, blurred vision Respiratory:   Denies  SOB, cough, wheeze, DIB  Cardiovascular:   Denies chest pain, palpitations, painful respirations Gastrointestinal:   Denies nausea, vomiting, diarrhea.  Endocrine:     Denies new hot or cold intolerance Musculoskeletal:  Denies joint swelling, gait issues, or new unexplained myalgias/ arthralgias Skin:  Denies rash, suspicious lesions  Neurological:    Denies dizziness, unexplained weakness, numbness  Psychiatric/Behavioral:   Denies mood changes    Objective:     Blood pressure 130/87, pulse 73, height 6\' 4"  (1.93 m), weight 257 lb (116.6 kg), SpO2 98 %.  Body mass index is 31.28 kg/m.  General: Well Developed, well nourished, and in no acute distress.  HEENT: Normocephalic, atraumatic, pupils equal round reactive to light, neck supple, No carotid bruits, no JVD Skin: Warm and dry, cap RF less 2 sec Cardiac: Regular rate and rhythm, S1, S2 WNL's, no murmurs rubs or gallops Respiratory: ECTA B/L, Not using accessory muscles, speaking in full sentences. NeuroM-Sk: Ambulates w/o assistance, moves ext * 4 w/o difficulty, sensation grossly intact.  Ext: scant edema b/l lower ext Psych: No HI/SI, judgement and insight good, Euthymic mood. Full Affect.

## 2018-05-28 ENCOUNTER — Telehealth: Payer: Self-pay | Admitting: Family Medicine

## 2018-05-28 NOTE — Telephone Encounter (Signed)
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 He says he can make an appt if necessary based on paper length. Please advise if this needs to be sch

## 2018-05-31 NOTE — Telephone Encounter (Signed)
Can you please call and have patient make an appointment.  Thanks

## 2018-06-04 ENCOUNTER — Encounter: Payer: Self-pay | Admitting: Family Medicine

## 2018-06-04 ENCOUNTER — Ambulatory Visit (INDEPENDENT_AMBULATORY_CARE_PROVIDER_SITE_OTHER): Payer: BC Managed Care – PPO | Admitting: Family Medicine

## 2018-06-04 VITALS — BP 117/77 | HR 73 | Ht 76.0 in | Wt 263.6 lb

## 2018-06-04 DIAGNOSIS — Z0289 Encounter for other administrative examinations: Secondary | ICD-10-CM | POA: Diagnosis not present

## 2018-06-04 NOTE — Patient Instructions (Signed)
Please obtain fasting blood work as well as the urine and other things that need to be done in the near future and we will fill out your paperwork with the latest BUN and creatinine since it is not sufficient that the last one we have was 2017.  Once this data is obtained and resulted, I have no problem filling out the rest of your sheets.

## 2018-06-04 NOTE — Progress Notes (Signed)
Impression and Recommendations:    1. Examination, physical, employee- for DOT     1. Medical Clearance for CDL License - Advised patient to have his ophthalmologist assess him for colorblindness.  - Advised patient to have glucose packs available to him at all times while driving, so that if he does begin to feel low, he can use one in event of lower blood sugars.  However, patient has never had low blood sugar while under care here.  - Visual test performed in office today. See results  - Hearing test performed in office today. See results  - Urine drawn in office today.  - Reviewed need for updated blood work.  2. Follow-Up - Return for regularly scheduled chronic follow up for DM maintenance- usually q 3 unless otherwise d/c pt.   Orders Placed This Encounter  Procedures  . POCT urinalysis dipstick    There are no discontinued medications.   Gross side effects, risk and benefits, and alternatives of medications and treatment plan in general discussed with patient.  Patient is aware that all medications have potential side effects and we are unable to predict every side effect or drug-drug interaction that may occur.   Patient will call with any questions prior to using medication if they have concerns.  Expresses verbal understanding and consents to current therapy and treatment regimen.  No barriers to understanding were identified.  Red flag symptoms and signs discussed in detail.  Patient expressed understanding regarding what to do in case of emergency\urgent symptoms  Please see AVS handed out to patient at the end of our visit for further patient instructions/ counseling done pertaining to today's office visit.   Return for 08-18-18 or so for DM f/up, BP, etc.    Note:  This note was prepared with assistance of Dragon voice recognition software. Occasional wrong-word or sound-a-like substitutions may have occurred due to the inherent limitations of voice  recognition software.   This document serves as a record of services personally performed by Thomasene Loteborah Emet Rafanan, DO. It was created on her behalf by Peggye FothergillKatherine Galloway, a trained medical scribe. The creation of this record is based on the scribe's personal observations and the provider's statements to them.   I have reviewed the above medical documentation for accuracy and completeness and I concur.  Thomasene LotDeborah Caleyah Jr 07/21/18 5:00 PM   ------------------------------------------------------------------------------------------------------------    Subjective:     HPI: Cristian Kennedy is a 22 y.o. male who [facility] today for issues as discussed below.  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  Patient denies surgeries or hospitalizations since he was a kid.  Type 1 Diabetes Diagnosed at age 22.  Patient takes NovoLog, adjustable dose, TID. Takes 30 units of Lantus QHS for diabetes.  Patient denies significant low blood sugars in the past three years.   Wt Readings from Last 3 Encounters:  06/04/18 263 lb 9.6 oz (119.6 kg)  04/18/18 257 lb (116.6 kg)  02/21/18 262 lb 4.8 oz (119 kg)   BP Readings from Last 3 Encounters:  06/04/18 117/77  04/18/18 130/87  02/21/18 132/88   Pulse Readings from Last 3 Encounters:  06/04/18 73  04/18/18 73  02/21/18 71   BMI Readings from Last 3 Encounters:  06/04/18 32.09 kg/m  04/18/18 31.28 kg/m  02/21/18 31.93 kg/m     Patient Care Team    Relationship Specialty Notifications Start End  Thomasene Lot, DO PCP - General Family Medicine  July 01, 2016      Patient Active Problem List   Diagnosis Date Noted  . Hypothyroidism 05/07/2017    Priority: High  . Obesity, Class I, BMI 30-34.9 05/07/2017    Priority: High  . Type I diabetes mellitus, uncontrolled (HCC) 06/13/2016    Priority: High  . Hypertriglyceridemia, familial 06/13/2016    Priority: High  .  Family history of early CAD- father died AMI age 9 July 01, 2016    Priority: High  . Elevated TSH 06/25/2016    Priority: Medium  . Vitamin D insufficiency 06/25/2016    Priority: Low  . Borderline hypertension 02/21/2018  . High risk medications (not anticoagulants) long-term use 02/21/2018  . Family history of essential hypertension 2016-07-01  . Family history of diabetes mellitus (DM) 07-01-16    Past Medical history, Surgical history, Family history, Social history, Allergies and Medications have been entered into the medical record, reviewed and changed as needed.    Current Meds  Medication Sig  . Cholecalciferol (VITAMIN D3) 5000 units TABS 5,000 IU OTC vitamin D3 daily.  . Insulin Glargine (LANTUS SOLOSTAR) 100 UNIT/ML Solostar Pen Inject 30 Units into the skin at bedtime. Please include QS 3 months.  . levothyroxine (SYNTHROID, LEVOTHROID) 50 MCG tablet Take 50 mcg by mouth daily before breakfast.  . losartan (COZAAR) 50 MG tablet Take 1 tablet (50 mg total) by mouth daily.  Marland Kitchen NOVOLOG FLEXPEN 100 UNIT/ML FlexPen 10-20 units 3 times daily with meals    Allergies:  No Known Allergies   Review of Systems:  A fourteen system review of systems was performed and found to be positive as per HPI.   Objective:   Blood pressure 117/77, pulse 73, height 6\' 4"  (1.93 m), weight 263 lb 9.6 oz (119.6 kg), SpO2 99 %. Body mass index is 32.09 kg/m. General:  Well Developed, well nourished, appropriate for stated age.  Neuro:  Alert and oriented,  extra-ocular muscles intact  HEENT:  Normocephalic, atraumatic, neck supple, no carotid bruits appreciated  Skin:  no gross rash, warm, pink. Cardiac:  RRR, S1 S2 Respiratory:  ECTA B/L and A/P, Not using accessory muscles, speaking in full sentences- unlabored. Vascular:  Ext warm, no cyanosis apprec.; cap RF less 2 sec. Psych:  No HI/SI, judgement and insight good, Euthymic mood. Full Affect.

## 2018-06-05 ENCOUNTER — Other Ambulatory Visit (INDEPENDENT_AMBULATORY_CARE_PROVIDER_SITE_OTHER): Payer: BC Managed Care – PPO

## 2018-06-05 DIAGNOSIS — E781 Pure hyperglyceridemia: Secondary | ICD-10-CM

## 2018-06-05 DIAGNOSIS — E039 Hypothyroidism, unspecified: Secondary | ICD-10-CM

## 2018-06-05 DIAGNOSIS — E559 Vitamin D deficiency, unspecified: Secondary | ICD-10-CM

## 2018-06-05 DIAGNOSIS — Z0000 Encounter for general adult medical examination without abnormal findings: Secondary | ICD-10-CM

## 2018-06-05 DIAGNOSIS — E10649 Type 1 diabetes mellitus with hypoglycemia without coma: Secondary | ICD-10-CM

## 2018-06-05 LAB — POCT URINALYSIS DIPSTICK
Bilirubin, UA: NEGATIVE
GLUCOSE UA: NEGATIVE
KETONES UA: NEGATIVE
Leukocytes, UA: NEGATIVE
NITRITE UA: NEGATIVE
PROTEIN UA: NEGATIVE
Spec Grav, UA: 1.02 (ref 1.010–1.025)
Urobilinogen, UA: 1 E.U./dL
pH, UA: 6.5 (ref 5.0–8.0)

## 2018-06-06 LAB — TSH: TSH: 2.74 u[IU]/mL (ref 0.450–4.500)

## 2018-06-06 LAB — CBC WITH DIFFERENTIAL/PLATELET
BASOS ABS: 0 10*3/uL (ref 0.0–0.2)
Basos: 0 %
EOS (ABSOLUTE): 0.1 10*3/uL (ref 0.0–0.4)
EOS: 1 %
HEMATOCRIT: 43.5 % (ref 37.5–51.0)
Hemoglobin: 14.7 g/dL (ref 13.0–17.7)
IMMATURE GRANULOCYTES: 0 %
Immature Grans (Abs): 0 10*3/uL (ref 0.0–0.1)
LYMPHS ABS: 2 10*3/uL (ref 0.7–3.1)
Lymphs: 28 %
MCH: 28.4 pg (ref 26.6–33.0)
MCHC: 33.8 g/dL (ref 31.5–35.7)
MCV: 84 fL (ref 79–97)
MONOS ABS: 0.5 10*3/uL (ref 0.1–0.9)
Monocytes: 7 %
NEUTROS PCT: 64 %
Neutrophils Absolute: 4.4 10*3/uL (ref 1.4–7.0)
PLATELETS: 192 10*3/uL (ref 150–450)
RBC: 5.17 x10E6/uL (ref 4.14–5.80)
RDW: 14.9 % (ref 12.3–15.4)
WBC: 6.9 10*3/uL (ref 3.4–10.8)

## 2018-06-06 LAB — COMPREHENSIVE METABOLIC PANEL
ALK PHOS: 61 IU/L (ref 39–117)
ALT: 15 IU/L (ref 0–44)
AST: 18 IU/L (ref 0–40)
Albumin/Globulin Ratio: 1.9 (ref 1.2–2.2)
Albumin: 4.6 g/dL (ref 3.5–5.5)
BUN/Creatinine Ratio: 12 (ref 9–20)
BUN: 12 mg/dL (ref 6–20)
Bilirubin Total: 0.6 mg/dL (ref 0.0–1.2)
CALCIUM: 9 mg/dL (ref 8.7–10.2)
CO2: 22 mmol/L (ref 20–29)
CREATININE: 0.99 mg/dL (ref 0.76–1.27)
Chloride: 101 mmol/L (ref 96–106)
GFR calc Af Amer: 124 mL/min/{1.73_m2} (ref >59)
GFR, EST NON AFRICAN AMERICAN: 107 mL/min/{1.73_m2} (ref >59)
GLOBULIN, TOTAL: 2.4 g/dL (ref 1.5–4.5)
Glucose: 90 mg/dL (ref 65–99)
Potassium: 3.8 mmol/L (ref 3.5–5.2)
Sodium: 140 mmol/L (ref 134–144)
Total Protein: 7 g/dL (ref 6.0–8.5)

## 2018-06-06 LAB — VITAMIN D 25 HYDROXY (VIT D DEFICIENCY, FRACTURES): Vit D, 25-Hydroxy: 33.4 ng/mL (ref 30.0–100.0)

## 2018-06-06 LAB — HEMOGLOBIN A1C
ESTIMATED AVERAGE GLUCOSE: 143 mg/dL
HEMOGLOBIN A1C: 6.6 % — AB (ref 4.8–5.6)

## 2018-06-07 ENCOUNTER — Other Ambulatory Visit: Payer: Self-pay

## 2018-06-07 DIAGNOSIS — Z0000 Encounter for general adult medical examination without abnormal findings: Secondary | ICD-10-CM

## 2018-06-07 DIAGNOSIS — E039 Hypothyroidism, unspecified: Secondary | ICD-10-CM

## 2018-06-07 DIAGNOSIS — E781 Pure hyperglyceridemia: Secondary | ICD-10-CM

## 2018-06-13 LAB — LIPID PANEL
CHOL/HDL RATIO: 2 ratio (ref 0.0–5.0)
Cholesterol, Total: 97 mg/dL — ABNORMAL LOW (ref 100–199)
HDL: 49 mg/dL (ref >39)
LDL Calculated: 34 mg/dL (ref 0–99)
TRIGLYCERIDES: 68 mg/dL (ref 0–149)
VLDL CHOLESTEROL CAL: 14 mg/dL (ref 5–40)

## 2018-06-13 LAB — SPECIMEN STATUS REPORT

## 2018-06-13 LAB — T4, FREE: FREE T4: 1.15 ng/dL (ref 0.82–1.77)

## 2018-07-17 ENCOUNTER — Ambulatory Visit: Payer: BC Managed Care – PPO | Admitting: Family Medicine

## 2018-08-27 ENCOUNTER — Ambulatory Visit: Payer: BC Managed Care – PPO | Admitting: Family Medicine

## 2018-09-06 ENCOUNTER — Telehealth: Payer: Self-pay | Admitting: Family Medicine

## 2018-09-11 ENCOUNTER — Telehealth: Payer: Self-pay | Admitting: Family Medicine

## 2018-09-11 ENCOUNTER — Telehealth: Payer: Self-pay

## 2018-09-11 DIAGNOSIS — E10649 Type 1 diabetes mellitus with hypoglycemia without coma: Secondary | ICD-10-CM

## 2018-09-11 NOTE — Telephone Encounter (Signed)
Pharmacy sent over a medication change request.  Insurance does not cover the Lantus, they cover Basalar, Levenir, or Guinea-Bissauresiba.  Patient was previous on Guinea-Bissauresiba and is requesting refill.  LOV 06/04/2018.  Please review and advise. MPulliam, CMA/RT(R)

## 2018-09-11 NOTE — Telephone Encounter (Signed)
Pt's mom called stating he is almost out of insulin & need an urgent refill sent for this on her Mychart to Dr. Sharee Holsterpalski but put my name on -- ( she also sent a picture of the one he needs in the media section of the Mychart)   Pt previous called Friday for the Novolog  Flexpin which he has picked up but now says he needs this Rx refilled.   Insulin Glargine (LANTUS SOLOSTAR) 100 UNIT/ML Solostar Pen [[id number]][[id number]]   Order Details  Dose: 30 Units Route: Subcutaneous Frequency: Daily at bedtime  Indications of Use: Type 1 Diabetes Mellitus  Dispense Quantity: 5 pen Refills: 2 Fills remaining: --        Sig: Inject 30 Units into the skin at bedtime. Please include QS 3 months.     Please send to :   [facility] [address] - [place] ([address]), [place] - [address] [phone] (Phone) [phone] (Fax)   --Forwarding message to medical assistant. -glh

## 2018-09-12 MED ORDER — INSULIN DEGLUDEC 100 UNIT/ML ~~LOC~~ SOPN: 30.0000 [IU] | PEN_INJECTOR | Freq: Every day | SUBCUTANEOUS | 0 refills | Status: DC

## 2018-09-12 NOTE — Telephone Encounter (Signed)
Pt was told last ov to f/up around 08/18/18---> so HE NEEDS OV.  So only give pt a 30d supply and NO RF until he is seen in clinic  Ok to give pt tresiba since basaglar not covered on insurance plan.  It is a one to one conversion ( 1:1 ) so please just keep pt on same dose.  Thnx, Dr Val Eagle

## 2018-09-12 NOTE — Telephone Encounter (Signed)
Sent in per note.  Patient called and notified.  MPulliam, CMA/RT(R)

## 2018-09-12 NOTE — Telephone Encounter (Signed)
Sent in per note.  Patient called and notified.  MPulliam, CMA/RT(R)  

## 2018-09-12 NOTE — Telephone Encounter (Signed)
Change in med Sent in per note.  Patient called and notified.  MPulliam, CMA/RT(R)

## 2018-09-25 ENCOUNTER — Ambulatory Visit: Payer: BC Managed Care – PPO | Admitting: Family Medicine

## 2018-09-30 ENCOUNTER — Ambulatory Visit (INDEPENDENT_AMBULATORY_CARE_PROVIDER_SITE_OTHER): Payer: BC Managed Care – PPO | Admitting: Family Medicine

## 2018-09-30 ENCOUNTER — Encounter: Payer: Self-pay | Admitting: Family Medicine

## 2018-09-30 VITALS — BP 130/84 | HR 65 | Temp 98.2°F | Ht 76.0 in | Wt 259.0 lb

## 2018-09-30 DIAGNOSIS — E039 Hypothyroidism, unspecified: Secondary | ICD-10-CM

## 2018-09-30 DIAGNOSIS — E669 Obesity, unspecified: Secondary | ICD-10-CM

## 2018-09-30 DIAGNOSIS — Z79899 Other long term (current) drug therapy: Secondary | ICD-10-CM

## 2018-09-30 DIAGNOSIS — E66811 Obesity, class 1: Secondary | ICD-10-CM

## 2018-09-30 DIAGNOSIS — E559 Vitamin D deficiency, unspecified: Secondary | ICD-10-CM | POA: Diagnosis not present

## 2018-09-30 DIAGNOSIS — E109 Type 1 diabetes mellitus without complications: Secondary | ICD-10-CM

## 2018-09-30 DIAGNOSIS — E10649 Type 1 diabetes mellitus with hypoglycemia without coma: Secondary | ICD-10-CM

## 2018-09-30 LAB — POCT GLYCOSYLATED HEMOGLOBIN (HGB A1C): HEMOGLOBIN A1C: 6.2 % — AB (ref 4.0–5.6)

## 2018-09-30 MED ORDER — INSULIN DEGLUDEC 100 UNIT/ML ~~LOC~~ SOPN: 30.0000 [IU] | PEN_INJECTOR | Freq: Every day | SUBCUTANEOUS | 2 refills | Status: DC

## 2018-09-30 NOTE — Progress Notes (Signed)
Impression and Recommendations:    1. Type 1 diabetes mellitus without complication (HCC)   2. Class 1 obesity with serious comorbidity in adult, unspecified BMI, unspecified obesity type   3. Hypothyroidism, unspecified type   4. Vitamin D insufficiency   5. Acquired hypothyroidism   6. High risk medications (not anticoagulants) long-term use   7. Uncontrolled type 1 diabetes mellitus with hypoglycemia without coma (HCC)    - Return for re-check of thyroid and A1c 6 months after last check, early February or March.  Come in for blood work first thing in the morning.  - Medications refilled today PRN.  See med list.  1. Uncontrolled type 1 diabetes mellitus with hypoglycemia without coma (HCC) - Stable - A1c today is 6.2.  Last A1c was 6.6 on 06/05/2018.  - Stable at this time. - Continue treatment plan as prescribed.  See med list. - Patient tolerating meds well without complication.  Denies S-E  - Counseled patient on pathophysiology of disease and discussed various treatment options, which often includes dietary and lifestyle modifications as first line.  Importance of low carb/ketogenic diet discussed with patient in addition to regular exercise.   - Check FBS and 2 hours after the biggest meal of your day.  Keep log and bring in next OV for my review.   Also, if you ever feel poorly, please check your blood pressure and blood sugar, as one or the other could be the cause of your symptoms.  - Being a diabetic, you need yearly eye and foot exams. Make appt.for diabetic eye exam.  - Return in 3 months for diabetes management visit.   2. H/o Hypothyroidism - last labs 06/05/2018 - TSH and free T4 stable last check.   - Patient was managed on synthroid in the past. - Discussed need to continue to monitor.  3. BMI Counseling - Class 1 Obesity, BMI 31.5 Explained to patient what BMI refers to, and what it means medically.    Told patient to think about it as a "medical risk  stratification measurement" and how increasing BMI is associated with increasing risk/ or worsening state of various diseases such as hypertension, hyperlipidemia, diabetes, premature OA, depression etc.  American Heart Association guidelines for healthy diet, basically Mediterranean diet, and exercise guidelines of 30 minutes 5 days per week or more discussed in detail.  Health counseling performed.  All questions answered.  4. Lifestyle & Preventative Health Maintenance - Advised patient to continue working toward exercising to improve overall mental, physical, and emotional health.    - Encouraged patient to engage in daily physical activity, especially a formal exercise routine.  Recommended that the patient eventually strive for at least 150 minutes of moderate cardiovascular activity per week according to guidelines established by the [facility].   - Healthy dietary habits encouraged, including low-carb, and high amounts of lean protein in diet.   - Patient should also consume adequate amounts of water.   Education and routine counseling performed. Handouts provided.   Orders Placed This Encounter  Procedures  . TSH  . T4, free  . T3, free  . Hemoglobin A1c  . Magnesium  . VITAMIN D 25 Hydroxy (Vit-D Deficiency, Fractures)  . POCT glycosylated hemoglobin (Hb A1C)      Meds ordered this encounter  Medications  . insulin degludec (TRESIBA FLEXTOUCH) 100 UNIT/ML SOPN FlexTouch Pen    Sig: Inject 0.3 mLs (30 Units total) into the skin at bedtime.    Dispense:  9 pen    Refill:  2    The patient was counseled, risk factors were discussed, anticipatory guidance given.  Gross side effects, risk and benefits, and alternatives of medications discussed with patient.  Patient is aware that all medications have potential side effects and we are unable to predict every side effect or drug-drug interaction that may occur.  Expresses verbal understanding and consents to current therapy plan  and treatment regimen.   Return for DM  follow up every 65mo; with a.m. blood work 3-4-day prior.   Please see AVS handed out to patient at the end of our visit for further patient instructions/ counseling done pertaining to today's office visit.    Note:  This document was prepared using Dragon voice recognition software and may include unintentional dictation errors.   This document serves as a record of services personally performed by Thomasene Loteborah Cheryal Salas, DO. It was created on her behalf by Peggye FothergillKatherine Galloway, a trained medical scribe. The creation of this record is based on the scribe's personal observations and the provider's statements to them.   I have reviewed the above medical documentation for accuracy and completeness and I concur.  Thomasene Loteborah Paige Monarrez, DO 10/09/2018 6:41 AM        Subjective:    Chief Complaint  Patient presents with  . Follow-up    Horald Pollenlexander L Brotzman is a 22 y.o. male who [facility].    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  DM HPI: -  He has not particularly been working on diet and exercise for diabetes  Pt is currently maintained on the following medications for diabetes:   see med list today Medication compliance - Is taking Tresiba instead of Lantus.  Still using NovoLog flex pen, 30 units at night.  Taking Vitamin D and losartan daily.  Isn't sure if he's taking synthroid.  He was managed on synthroid in the past.   Denies polyuria/polydipsia. Denies hypo/ hyperglycemia symptoms - He denies new onset of: chest pain, exercise intolerance, shortness of breath, dizziness, visual changes, headache, lower extremity swelling or claudication.   Last diabetic eye exam was No results found for: HMDIABEYEEXA  Foot exam - UTD  Last A1C in the office was:  Lab Results  Component Value Date   HGBA1C  6.2 (A) 09/30/2018   HGBA1C 6.6 (H) 06/05/2018   HGBA1C 6.4 (A) 04/18/2018    Lab Results  Component Value Date   MICROALBUR 10 01/09/2018   LDLCALC 34 06/05/2018   CREATININE 0.99 06/05/2018      Last 3 blood pressure readings in our office are as follows: BP Readings from Last 3 Encounters:  09/30/18 130/84  06/04/18 117/77  04/18/18 130/87    BMI Readings from Last 3 Encounters:  09/30/18 31.53 kg/m  06/04/18 32.09 kg/m  04/18/18 31.28 kg/m     No problems updated.    Patient Care Team    Relationship Specialty Notifications Start End  Thomasene Lotpalski, Laiylah Roettger, DO PCP - General Family Medicine  06/07/16      Patient Active Problem List   Diagnosis Date Noted  . Class 1 obesity with serious comorbidity in adult 09/30/2018    Priority: High  . Type I diabetes mellitus, uncontrolled (HCC) 06/13/2016    Priority: High  . Hypertriglyceridemia, familial 06/13/2016    Priority: High  . Family history  of early CAD- father died AMI age 93 07-06-2016    Priority: High  . High risk medications (not anticoagulants) long-term use 02/21/2018    Priority: Medium  . h/o Hypothyroidism 05/07/2017    Priority: Medium  . Elevated TSH 06/25/2016    Priority: Medium  . Borderline hypertension 02/21/2018    Priority: Low  . Vitamin D insufficiency 06/25/2016    Priority: Low  . Family history of essential hypertension 06-Jul-2016    Priority: Low  . Family history of diabetes mellitus (DM) 2016/07/06    Priority: Low     Past Medical History:  Diagnosis Date  . Family history of diabetes mellitus (DM) 2016/07/06  . Family history of early CAD- father died AMI age 21 2016-07-06  . Family history of essential hypertension 07-06-16  . Overweight (BMI 25.0-29.9) 2016/07/06     Past Surgical History:  Procedure Laterality Date  . TONSILLECTOMY AND ADENOIDECTOMY    . TYMPANOSTOMY TUBE PLACEMENT       Family History  Problem Relation Age of Onset  . Cancer Mother         leukemia  . Hypertension Mother   . Alcohol abuse Father   . Stroke Father   . Alcohol abuse Paternal Uncle   . Diabetes Maternal Grandmother   . Hypertension Maternal Grandmother   . Cancer Paternal Grandmother        stomach/liver  . Diabetes Paternal Grandmother   . Hypertension Paternal Grandmother   . Alcohol abuse Paternal Grandfather      Social History   Substance and Sexual Activity  Drug Use No  ,  Social History   Substance and Sexual Activity  Alcohol Use No  ,  Social History   Tobacco Use  Smoking Status Never Smoker  Smokeless Tobacco Never Used  ,    Current Outpatient Medications on File Prior to Visit  Medication Sig Dispense Refill  . Cholecalciferol (VITAMIN D3) 5000 units TABS 5,000 IU OTC vitamin D3 daily. 90 tablet 3  . levothyroxine (SYNTHROID, LEVOTHROID) 50 MCG tablet Take 50 mcg by mouth daily before breakfast.    . losartan (COZAAR) 50 MG tablet Take 1 tablet (50 mg total) by mouth daily. 90 tablet 3  . NOVOLOG FLEXPEN 100 UNIT/ML FlexPen 10-20 units 3 times daily with meals 15 mL 2   No current facility-administered medications on file prior to visit.      No Known Allergies   Review of Systems:   General:  Denies fever, chills Optho/Auditory:   Denies visual changes, blurred vision Respiratory:   Denies SOB, cough, wheeze, DIB  Cardiovascular:   Denies chest pain, palpitations, painful respirations Gastrointestinal:   Denies nausea, vomiting, diarrhea.  Endocrine:     Denies new hot or cold intolerance Musculoskeletal:  Denies joint swelling, gait issues, or new unexplained myalgias/ arthralgias Skin:  Denies rash, suspicious lesions  Neurological:    Denies dizziness, unexplained weakness, numbness  Psychiatric/Behavioral:   Denies mood changes    Objective:     Blood pressure 130/84, pulse 65, temperature 98.2 F (36.8 C), height 6\' 4"  (1.93 m), weight 259 lb (117.5 kg), SpO2 100 %.  Body mass index is 31.53  kg/m.  General: Well Developed, well nourished, and in no acute distress.  HEENT: Normocephalic, atraumatic, pupils equal round reactive to light, neck supple, No carotid bruits, no JVD Skin: Warm and dry, cap RF less 2 sec Cardiac: Regular rate and rhythm, S1, S2 WNL's, no murmurs rubs or gallops Respiratory:  ECTA B/L, Not using accessory muscles, speaking in full sentences. NeuroM-Sk: Ambulates w/o assistance, moves ext * 4 w/o difficulty, sensation grossly intact.  Ext: scant edema b/l lower ext Psych: No HI/SI, judgement and insight good, Euthymic mood. Full Affect.

## 2018-09-30 NOTE — Patient Instructions (Addendum)
Patient will need hemoglobin A1c, magnesium level, thyroid labs to include free T3 and free T4 as well as vitamin D a couple days prior to next office visit.     Diabetes Mellitus and Standards of Medical Care  Managing diabetes (diabetes mellitus) can be complicated. Your diabetes treatment may be managed by a team of health care providers, including:  A diet and nutrition specialist (registered dietitian).  A nurse.  A certified diabetes educator (CDE).  A diabetes specialist (endocrinologist).  An eye doctor.  A primary care provider.  A dentist.  Your health care providers follow a schedule in order to help you get the best quality of care. The following schedule is a general guideline for your diabetes management plan. Your health care providers may also give you more specific instructions.  HbA1c (hemoglobin A1c) test This test provides information about blood sugar (glucose) control over the previous 2-3 months. It is used to check whether your diabetes management plan needs to be adjusted.  If you are meeting your treatment goals, this test is done at least 2 times a year.  If you are not meeting treatment goals or if your treatment goals have changed, this test is done 4 times a year.  Blood pressure test  This test is done at every routine medical visit. For most people, the goal is less than 130/80. Ask your health care provider what your goal blood pressure should be.  Dental and eye exams  Visit your dentist two times a year.  If you have type 1 diabetes, get an eye exam 3-5 years after you are diagnosed, and then once a year after your first exam. ? If you were diagnosed with type 1 diabetes as a child, get an eye exam when you are age 51 or older and have had diabetes for 3-5 years. After the first exam, you should get an eye exam once a year.  If you have type 2 diabetes, have an eye exam as soon as you are diagnosed, and then once a year after your first  exam.  Foot care exam  Visual foot exams are done at every routine medical visit. The exams check for cuts, bruises, redness, blisters, sores, or other problems with the feet.  A complete foot exam is done by your health care provider once a year. This exam includes an inspection of the structure and skin of your feet, and a check of the pulses and sensation in your feet. ? Type 1 diabetes: Get your first exam 3-5 years after diagnosis. ? Type 2 diabetes: Get your first exam as soon as you are diagnosed.  Check your feet every day for cuts, bruises, redness, blisters, or sores. If you have any of these or other problems that are not healing, contact your health care provider.  Kidney function test (urine microalbumin)  This test is done once a year. ? Type 1 diabetes: Get your first test 5 years after diagnosis. ? Type 2 diabetes: Get your first test as soon as you are diagnosed._  If you have chronic kidney disease (CKD), get a serum creatinine and estimated glomerular filtration rate (eGFR) test once a year.  Lipid profile (cholesterol, HDL, LDL, triglycerides)  This test should be done when you are diagnosed with diabetes, and every 5 years after the first test. If you are on medicines to lower your cholesterol, you may need to get this test done every year. ? The goal for LDL is less than 100 mg/dL (  5.5 mmol/L). If you are at high risk, the goal is less than 70 mg/dL (3.9 mmol/L). ? The goal for HDL is 40 mg/dL (2.2 mmol/L) for men and 50 mg/dL(2.8 mmol/L) for women. An HDL cholesterol of 60 mg/dL (3.3 mmol/L) or higher gives some protection against heart disease. ? The goal for triglycerides is less than 150 mg/dL (8.3 mmol/L).  Immunizations  The yearly flu (influenza) vaccine is recommended for everyone 6 months or older who has diabetes.  The pneumonia (pneumococcal) vaccine is recommended for everyone 2 years or older who has diabetes. If you are 7 or older, you may get the  pneumonia vaccine as a series of two separate shots.  The hepatitis B vaccine is recommended for adults shortly after they have been diagnosed with diabetes.  The Tdap (tetanus, diphtheria, and pertussis) vaccine should be given: ? According to normal childhood vaccination schedules, for children. ? Every 10 years, for adults who have diabetes.  The shingles vaccine is recommended for people who have had chicken pox and are 50 years or older.  Mental and emotional health  Screening for symptoms of eating disorders, anxiety, and depression is recommended at the time of diagnosis and afterward as needed. If your screening shows that you have symptoms (you have a positive screening result), you may need further evaluation and be referred to a mental health care provider.  Diabetes self-management education  Education about how to manage your diabetes is recommended at diagnosis and ongoing as needed.  Treatment plan  Your treatment plan will be reviewed at every medical visit.  Summary  Managing diabetes (diabetes mellitus) can be complicated. Your diabetes treatment may be managed by a team of health care providers.  Your health care providers follow a schedule in order to help you get the best quality of care.  Standards of care including having regular physical exams, blood tests, blood pressure monitoring, immunizations, screening tests, and education about how to manage your diabetes.  Your health care providers may also give you more specific instructions based on your individual health.      Type 2 Diabetes Mellitus, Self Care, Adult Caring for yourself after you have been diagnosed with type 2 diabetes (type 2 diabetes mellitus) means keeping your blood sugar (glucose) under control with a balance of:  Nutrition.  Exercise.  Lifestyle changes.  Medicines or insulin, if necessary.  Support from your team of health care providers and others.  The following  information explains what you need to know to manage your diabetes at home. What do I need to do to manage my blood glucose?  Check your blood glucose every day, as often as told by your health care provider.  Contact your health care provider if your blood glucose is above your target for 2 tests in a row.  Have your A1c (hemoglobin A1c) level checked at least two times a year, or as often as told by your health care provider. Your health care provider will set individualized treatment goals for you. Generally, the goal of treatment is to maintain the following blood glucose levels:  Before meals (preprandial): 80-130 mg/dL (4.4-7.2 mmol/L).  After meals (postprandial): below 180 mg/dL (10 mmol/L).  A1c level: less than 7%.  What do I need to know about hyperglycemia and hypoglycemia? What is hyperglycemia? Hyperglycemia, also called high blood glucose, occurs when blood glucose is too high.Make sure you know the early signs of hyperglycemia, such as:  Increased thirst.  Hunger.  Feeling very tired.  Needing to urinate more often than usual.  Blurry vision.  What is hypoglycemia? Hypoglycemia, also called low blood glucose, occurswith a blood glucose level at or below 70 mg/dL (3.9 mmol/L). The risk for hypoglycemia increases during or after exercise, during sleep, during illness, and when skipping meals or not eating for a long time (fasting). It is important to know the symptoms of hypoglycemia and treat it right away. Always have a 15-gram rapid-acting carbohydrate snack with you to treat low blood glucose. Family members and close friends should also know the symptoms and should understand how to treat hypoglycemia, in case you are not able to treat yourself. What are the symptoms of hypoglycemia? Hypoglycemia symptoms can include:  Hunger.  Anxiety.  Sweating and feeling clammy.  Confusion.  Dizziness or feeling light-headed.  Sleepiness.  Nausea.  Increased  heart rate.  Headache.  Blurry vision.  Seizure.  Nightmares.  Tingling or numbness around the mouth, lips, or tongue.  A change in speech.  Decreased ability to concentrate.  A change in coordination.  Restless sleep.  Tremors or shakes.  Fainting.  Irritability.  How do I treat hypoglycemia?  If you are alert and able to swallow safely, follow the 15:15 rule:  Take 15 grams of a rapid-acting carbohydrate. Rapid-acting options include: ? 1 tube of glucose gel. ? 3 glucose pills. ? 6-8 pieces of hard candy. ? 4 oz (120 mL) of fruit juice. ? 4 oz (120 mL) of regular (not diet) soda.  Check your blood glucose 15 minutes after you take the carbohydrate.  If the repeat blood glucose level is still at or below 70 mg/dL (3.9 mmol/L), take 15 grams of a carbohydrate again.  If your blood glucose level does not increase above 70 mg/dL (3.9 mmol/L) after 3 tries, seek emergency medical care.  After your blood glucose level returns to normal, eat a meal or a snack within 1 hour.  How do I treat severe hypoglycemia? Severe hypoglycemia is when your blood glucose level is at or below 54 mg/dL (3 mmol/L). Severe hypoglycemia is an emergency. Do not wait to see if the symptoms will go away. Get medical help right away. Call your local emergency services (911 in the [place] Do not drive yourself to the hospital. If you have severe hypoglycemia and you cannot eat or drink, you may need an injection of glucagon. A family member or close friend should learn how to check your blood glucose and how to give you a glucagon injection. Ask your health care provider if you need to have an emergency glucagon injection kit available. Severe hypoglycemia may need to be treated in a hospital. The treatment may include getting glucose through an IV tube. You may also need treatment for the cause of your hypoglycemia. Can having diabetes put me at risk for other conditions? Having diabetes can put  you at risk for other long-term (chronic) conditions, such as heart disease and kidney disease. Your health care provider may prescribe medicines to help prevent complications from diabetes. These medicines may include:  Aspirin.  Medicine to lower cholesterol.  Medicine to control blood pressure.  What else can I do to manage my diabetes? Take your diabetes medicines as told  If your health care provider prescribed insulin or diabetes medicines, take them every day.  Do not run out of insulin or other diabetes medicines that you take. Plan ahead so you always have these available.  If you use insulin, adjust your dosage based on  how physically active you are and what foods you eat. Your health care provider will tell you how to adjust your dosage. Make healthy food choices  The things that you eat and drink affect your blood glucose and your insulin dosage. Making good choices helps to control your diabetes and prevent other health problems. A healthy meal plan includes eating lean proteins, complex carbohydrates, fresh fruits and vegetables, low-fat dairy products, and healthy fats. Make an appointment to see a diet and nutrition specialist (registered dietitian) to help you create an eating plan that is right for you. Make sure that you:  Follow instructions from your health care provider about eating or drinking restrictions.  Drink enough fluid to keep your urine clear or pale yellow.  Eat healthy snacks between nutritious meals.  Track the carbohydrates that you eat. Do this by reading food labels and learning the standard serving sizes of foods.  Follow your sick day plan whenever you cannot eat or drink as usual. Make this plan in advance with your health care provider.  Stay active  Exercise regularly, as told by your health care provider. This may include:  Stretching and doing strength exercises, such as yoga or weightlifting, at least 2 times a week.  Doing at least  150 minutes of moderate-intensity or vigorous-intensity exercise each week. This could be brisk walking, biking, or water aerobics. ? Spread out your activity over at least 3 days of the week. ? Do not go more than 2 days in a row without doing some kind of physical activity.  When you start a new exercise or activity, work with your health care provider to adjust your insulin, medicines, or food intake as needed. Make healthy lifestyle choices  Do not use any tobacco products, such as cigarettes, chewing tobacco, and e-cigarettes. If you need help quitting, ask your health care provider.  If your health care provider says that alcohol is safe for you, limit alcohol intake to no more than 1 drink per day for nonpregnant women and 2 drinks per day for men. One drink equals 12 oz of beer, 5 oz of wine, or 1 oz of hard liquor.  Learn to manage stress. If you need help with this, ask your health care provider. Care for your body   Keep your immunizations up to date. In addition to getting vaccinations as told by your health care provider, it is recommended that you get vaccinated against the following illnesses: ? The flu (influenza). Get a flu shot every year. ? Pneumonia. ? Hepatitis B.  Schedule an eye exam soon after your diagnosis, and then one time every year after that.  Check your skin and feet every day for cuts, bruises, redness, blisters, or sores. Schedule a foot exam with your health care provider once every year.  Brush your teeth and gums two times a day, and floss at least one time a day. Visit your dentist at least once every 6 months.  Maintain a healthy weight. General instructions  Take over-the-counter and prescription medicines only as told by your health care provider.  Share your diabetes management plan with people in your workplace, school, and household.  Check your urine for ketones when you are ill and as told by your health care provider.  Ask your health  care provider: ? Do I need to meet with a diabetes educator? ? Where can I find a support group for people with diabetes?  Carry a medical alert card or wear medical alert  jewelry.  Keep all follow-up visits as told by your health care provider. This is important. Where to find more information: For more information about diabetes, visit:  American Diabetes Association (ADA): www.diabetes.org  American Association of Diabetes Educators (AADE): www.diabeteseducator.org/patient-resources  This information is not intended to replace advice given to you by your health care provider. Make sure you discuss any questions you have with your health care provider. Document Released: 02/07/2016 Document Revised: 03/23/2016 Document Reviewed: 11/19/2015 Elsevier Interactive Patient Education  2017 [place].      Blood Glucose Monitoring, Adult Monitoring your blood sugar (glucose) helps you manage your diabetes. It also helps you and your health care provider determine how well your diabetes management plan is working. Blood glucose monitoring involves checking your blood glucose as often as directed, and keeping a record (log) of your results over time. Why should I monitor my blood glucose? Checking your blood glucose regularly can:  Help you understand how food, exercise, illnesses, and medicines affect your blood glucose.  Let you know what your blood glucose is at any time. You can quickly tell if you are having low blood glucose (hypoglycemia) or high blood glucose (hyperglycemia).  Help you and your health care provider adjust your medicines as needed.  When should I check my blood glucose? Follow instructions from your health care provider about how often to check your blood glucose.   This may depend on:  The type of diabetes you have.  How well-controlled your diabetes is.  Medicines you are taking.  If you have type 1 diabetes:  Check your blood glucose at least 2  times a day.  Also check your blood glucose: ? Before every insulin injection. ? Before and after exercise. ? Between meals. ? 2 hours after a meal. ? Occasionally between 2:00 a.m. and 3:00 a.m., as directed. ? Before potentially dangerous tasks, like driving or using heavy machinery. ? At bedtime.  You may need to check your blood glucose more often, up to 6-10 times a day: ? If you use an insulin pump. ? If you need multiple daily injections (MDI). ? If your diabetes is not well-controlled. ? If you are ill. ? If you have a history of severe hypoglycemia. ? If you have a history of not knowing when your blood glucose is getting low (hypoglycemia unawareness).  If you have type 2 diabetes:  If you take insulin or other diabetes medicines, check your blood glucose at least 2 times a day.  If you are on intensive insulin therapy, check your blood glucose at least 4 times a day. Occasionally, you may also need to check between 2:00 a.m. and 3:00 a.m., as directed.  Also check your blood glucose: ? Before and after exercise. ? Before potentially dangerous tasks, like driving or using heavy machinery.  You may need to check your blood glucose more often if: ? Your medicine is being adjusted. ? Your diabetes is not well-controlled. ? You are ill.  What is a blood glucose log?  A blood glucose log is a record of your blood glucose readings. It helps you and your health care provider: ? Look for patterns in your blood glucose over time. ? Adjust your diabetes management plan as needed.  Every time you check your blood glucose, write down your result and notes about things that may be affecting your blood glucose, such as your diet and exercise for the day.  Most glucose meters store a record of glucose readings in  the meter. Some meters allow you to download your records to a computer. How do I check my blood glucose? Follow these steps to get accurate readings of your blood  glucose: Supplies needed   Blood glucose meter.  Test strips for your meter. Each meter has its own strips. You must use the strips that come with your meter.  A needle to prick your finger (lancet). Do not use lancets more than once.  A device that holds the lancet (lancing device).  A journal or log book to write down your results.  Procedure  Wash your hands with soap and water.  Prick the side of your finger (not the tip) with the lancet. Use a different finger each time.  Gently rub the finger until a small drop of blood appears.  Follow instructions that come with your meter for inserting the test strip, applying blood to the strip, and using your blood glucose meter.  Write down your result and any notes.  Alternative testing sites  Some meters allow you to use areas of your body other than your finger (alternative sites) to test your blood.  If you think you may have hypoglycemia, or if you have hypoglycemia unawareness, do not use alternative sites. Use your finger instead.  Alternative sites may not be as accurate as the fingers, because blood flow is slower in these areas. This means that the result you get may be delayed, and it may be different from the result that you would get from your finger.  The most common alternative sites are: ? Forearm. ? Thigh. ? Palm of the hand.  Additional tips  Always keep your supplies with you.  If you have questions or need help, all blood glucose meters have a 24-hour "hotline" number that you can call. You may also contact your health care provider.  After you use a few boxes of test strips, adjust (calibrate) your blood glucose meter by following instructions that came with your meter.    The American Diabetes Association suggests the following targets for most nonpregnant adults with diabetes.  More or less stringent glycemic goals may be appropriate for each individual.  A1C: Less than 7% A1C may also be reported  as eAG: Less than 154 mg/dl Before a meal (preprandial plasma glucose): 80-130 mg/dl 1-2 hours after beginning of the meal (Postprandial plasma glucose)*: Less than 180 mg/dl  *Postprandial glucose may be targeted if A1C goals are not met despite reaching preprandial glucose goals.   GOALS in short:  The goals are for the Hgb A1C to be less than 7.0 & blood pressure to be less than 130/80.    It is recommended that all diabetics are educated on and follow a healthy diabetic diet, exercise for 30 minutes 3-4 times per week (walking, biking, swimming, or machine), monitor blood glucose readings and bring that record with you to be reviewed at your next office visit.     You should be checking fasting blood sugars- especially after you eat poorly or eat really healthy, and also check 2 hour postprandial blood sugars after largest meal of the day.    Write these down and bring in your log at each office visit.    You will need to be seen every 3 months by the provider managing your Diabetes unless told otherwise by that provider.   You will need yearly eye exams from an eye specialist and foot exams to check the nerves of your feet.  Also, your urine  should be checked yearly as well to make sure excess protein is not present.   If you are checking your blood pressure at home, please record it and bring it to your next office visit.    Follow the Dietary Approaches to Stop Hypertension (DASH) diet (3 servings of fruit and vegetables daily, whole grains, low sodium, low-fat proteins).  See below.    Lastly, when it comes to your cholesterol, the goal is to have the HDL (good cholesterol) >40, and the LDL (bad cholesterol) <100.   It is recommended that you follow a heart healthy, low saturated and trans-fat diet and exercise for 30 minutes at least 5 times a week.     (( Check out the DASH diet = 1.5 Gram Low Sodium Diet   A 1.5 gram sodium diet restricts the amount of sodium in the diet to no  more than 1.5 g or 1500 mg daily.  The American Heart Association recommends Americans over the age of 2 to consume no more than 1500 mg of sodium each day to reduce the risk of developing high blood pressure.  Research also shows that limiting sodium may reduce heart attack and stroke risk.  Many foods contain sodium for flavor and sometimes as a preservative.  When the amount of sodium in a diet needs to be low, it is important to know what to look for when choosing foods and drinks.  The following includes some information and guidelines to help make it easier for you to adapt to a low sodium diet.    QUICK TIPS  Do not add salt to food.  Avoid convenience items and fast food.  Choose unsalted snack foods.  Buy lower sodium products, often labeled as "lower sodium" or "no salt added."  Check food labels to learn how much sodium is in 1 serving.  When eating at a restaurant, ask that your food be prepared with less salt or none, if possible.    READING FOOD LABELS FOR SODIUM INFORMATION  The nutrition facts label is a good place to find how much sodium is in foods. Look for products with no more than 400 mg of sodium per serving.  Remember that 1.5 g = 1500 mg.  The food label may also list foods as:  Sodium-free: Less than 5 mg in a serving.  Very low sodium: 35 mg or less in a serving.  Low-sodium: 140 mg or less in a serving.  Light in sodium: 50% less sodium in a serving. For example, if a food that usually has 300 mg of sodium is changed to become light in sodium, it will have 150 mg of sodium.  Reduced sodium: 25% less sodium in a serving. For example, if a food that usually has 400 mg of sodium is changed to reduced sodium, it will have 300 mg of sodium.    CHOOSING FOODS  Grains  Avoid: Salted crackers and snack items. Some cereals, including instant hot cereals. Bread stuffing and biscuit mixes. Seasoned rice or pasta mixes.  Choose: Unsalted snack items. Low-sodium cereals,  oats, puffed wheat and rice, shredded wheat. English muffins and bread. Pasta.  Meats  Avoid: Salted, canned, smoked, spiced, pickled meats, including fish and poultry. Bacon, ham, sausage, cold cuts, hot dogs, anchovies.  Choose: Low-sodium canned tuna and salmon. Fresh or frozen meat, poultry, and fish.  Dairy  Avoid: Processed cheese and spreads. Cottage cheese. Buttermilk and condensed milk. Regular cheese.  Choose: Milk. Low-sodium cottage cheese. Yogurt. Sour  cream. Low-sodium cheese.  Fruits and Vegetables  Avoid: Regular canned vegetables. Regular canned tomato sauce and paste. Frozen vegetables in sauces. Olives. Angie Fava. Relishes. Sauerkraut.  Choose: Low-sodium canned vegetables. Low-sodium tomato sauce and paste. Frozen or fresh vegetables. Fresh and frozen fruit.  Condiments  Avoid: Canned and packaged gravies. Worcestershire sauce. Tartar sauce. Barbecue sauce. Soy sauce. Steak sauce. Ketchup. Onion, garlic, and table salt. Meat flavorings and tenderizers.  Choose: Fresh and dried herbs and spices. Low-sodium varieties of mustard and ketchup. Lemon juice. Tabasco sauce. Horseradish.    SAMPLE 1.5 GRAM SODIUM MEAL PLAN:   Breakfast / Sodium (mg)  1 cup low-fat milk / 143 mg  1 whole-wheat English muffin / 240 mg  1 tbs heart-healthy margarine / 153 mg  1 hard-boiled egg / 139 mg  1 small orange / 0 mg  Lunch / Sodium (mg)  1 cup raw carrots / 76 mg  2 tbs no salt added peanut butter / 5 mg  2 slices whole-wheat bread / 270 mg  1 tbs jelly / 6 mg   cup red grapes / 2 mg  Dinner / Sodium (mg)  1 cup whole-wheat pasta / 2 mg  1 cup low-sodium tomato sauce / 73 mg  3 oz lean ground beef / 57 mg  1 small side salad (1 cup raw spinach leaves,  cup cucumber,  cup yellow bell pepper) with 1 tsp olive oil and 1 tsp red wine vinegar / 25 mg  Snack / Sodium (mg)  1 container low-fat vanilla yogurt / 107 mg  3 graham cracker squares / 127 mg  Nutrient Analysis  Calories: 1745   Protein: 75 g  Carbohydrate: 237 g  Fat: 57 g  Sodium: 1425 mg  Document Released: 10/16/2005 Document Revised: 06/28/2011 Document Reviewed: 01/17/2010  ExitCare Patient Information 2012 [employer].))    This information is not intended to replace advice given to you by your health care provider. Make sure you discuss any questions you have with your health care provider. Document Released: 10/19/2003 Document Revised: 05/05/2016 Document Reviewed: 03/27/2016 Elsevier Interactive Patient Education  2017 [employer].

## 2019-01-08 ENCOUNTER — Ambulatory Visit: Payer: BC Managed Care – PPO | Admitting: Family Medicine

## 2019-01-24 ENCOUNTER — Ambulatory Visit (INDEPENDENT_AMBULATORY_CARE_PROVIDER_SITE_OTHER): Payer: BC Managed Care – PPO | Admitting: Family Medicine

## 2019-01-24 ENCOUNTER — Encounter: Payer: Self-pay | Admitting: Family Medicine

## 2019-01-24 ENCOUNTER — Other Ambulatory Visit: Payer: Self-pay

## 2019-01-24 VITALS — BP 130/78 | HR 68 | Wt 254.6 lb

## 2019-01-24 DIAGNOSIS — E1159 Type 2 diabetes mellitus with other circulatory complications: Secondary | ICD-10-CM | POA: Insufficient documentation

## 2019-01-24 DIAGNOSIS — I10 Essential (primary) hypertension: Secondary | ICD-10-CM

## 2019-01-24 DIAGNOSIS — E559 Vitamin D deficiency, unspecified: Secondary | ICD-10-CM | POA: Diagnosis not present

## 2019-01-24 DIAGNOSIS — E109 Type 1 diabetes mellitus without complications: Secondary | ICD-10-CM

## 2019-01-24 DIAGNOSIS — E039 Hypothyroidism, unspecified: Secondary | ICD-10-CM

## 2019-01-24 DIAGNOSIS — I152 Hypertension secondary to endocrine disorders: Secondary | ICD-10-CM | POA: Insufficient documentation

## 2019-01-24 MED ORDER — INSULIN DEGLUDEC 100 UNIT/ML ~~LOC~~ SOPN: 30.0000 [IU] | PEN_INJECTOR | Freq: Every day | SUBCUTANEOUS | 5 refills | Status: DC

## 2019-01-24 NOTE — Progress Notes (Signed)
Virtual Visit via Telephone Note for [employer], D.O- Primary Care Physician at [facility]   I connected with current patient today by telephone and verified that I am speaking with the correct person using two identifiers.   I discussed the limitations, risks, security and privacy concerns of performing an evaluation and management service by telephone and the limited availability of in person appointments during this current national crisis of the Covid-19 pandemic.  My staff members also discussed with the patient that there may be a patient responsible charge related to this service.  The patient expressed understanding and agreed to proceed.      History of Present Illness:   Patient's last office visit was on 09/30/2018.   At that time he was told to return for recheck of thyroid and A1c in 6 months.   That would have been February or March.   So he is overdue for his blood work.   Diabetes:  A1c last checked was 6.2 on 09/30/2018.   Lately with doing a lot of work outside- well controlled- 75, 110.   No lows that cause sx.   Had a 68 one night after working in yard all day.     Vit D: Taking daily vitamin D when he could remember.  States his energy levels have been good.   Hypothyroidism: Taking his Synthroid daily.  No complaints or concerns.    HTN:   Well controlled; on losartan.   Health counseling: Patient does have some concerns about Covid-19.  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  He is taking care of his elderly mother who also has diabetes and is obese etc.  She is also a patient of mine.    Wt Readings from Last 3 Encounters:  01/24/19 254 lb 9.6 oz (115.5 kg)  09/30/18 259 lb (117.5 kg)  06/04/18 263 lb 9.6 oz (119.6 kg)   BP Readings from Last 3 Encounters:  01/24/19 130/78  09/30/18 130/84  06/04/18 117/77   Pulse Readings from Last 3 Encounters:  01/24/19 68  09/30/18 65  06/04/18 73   BMI Readings from Last 3 Encounters:   01/24/19 30.99 kg/m  09/30/18 31.53 kg/m  06/04/18 32.09 kg/m     Patient Care Team    Relationship Specialty Notifications Start End  Thomasene Lot, DO PCP - General Family Medicine  June 10, 2016      Patient Active Problem List   Diagnosis Date Noted  . Class 1 obesity with serious comorbidity in adult 09/30/2018    Priority: High  . Type 1 diabetes mellitus without complication (HCC) 06/13/2016    Priority: High  . Hypertriglyceridemia, familial 06/13/2016    Priority: High  . Family history of early CAD- father died AMI age 48 10-Jun-2016    Priority: High  . High risk medications (not anticoagulants) long-term use 02/21/2018    Priority: Medium  . h/o Hypothyroidism 05/07/2017    Priority: Medium  . Elevated TSH 06/25/2016    Priority: Medium  . Borderline hypertension 02/21/2018    Priority: Low  . Vitamin D insufficiency 06/25/2016    Priority: Low  . Family history of essential hypertension 2016-06-10    Priority: Low  . Family history of diabetes mellitus (DM) 2016/06/10    Priority: Low  . Hypertension associated with diabetes (HCC) 01/24/2019     Current Meds  Medication Sig  . Cholecalciferol (VITAMIN D3) 5000 units TABS 5,000 IU OTC vitamin D3 daily.  Marland Kitchen  insulin degludec (TRESIBA FLEXTOUCH) 100 UNIT/ML SOPN FlexTouch Pen Inject 0.3 mLs (30 Units total) into the skin at bedtime.  Marland Kitchen levothyroxine (SYNTHROID, LEVOTHROID) 50 MCG tablet Take 50 mcg by mouth daily before breakfast.  . losartan (COZAAR) 50 MG tablet Take 1 tablet (50 mg total) by mouth daily.  Marland Kitchen NOVOLOG FLEXPEN 100 UNIT/ML FlexPen 10-20 units 3 times daily with meals  . [DISCONTINUED] insulin degludec (TRESIBA FLEXTOUCH) 100 UNIT/ML SOPN FlexTouch Pen Inject 0.3 mLs (30 Units total) into the skin at bedtime.     Allergies:  No Known Allergies   ROS:  See above HPI for pertinent positives and negatives   Objective:   Blood pressure 130/78, pulse 68, weight 254 lb 9.6 oz (115.5  kg). Body mass index is 30.99 kg/m.   General: sounds in no acute distress.  Skin: Pt confirms warm and dry  extremities and pink fingertips Respiratory: speaking in full sentences, no conversational dyspnea Psych: A and O *3, appears insight good, mood- full      Impression and Recommendations:    1. Type 1 diabetes mellitus without complication (HCC) Well-controlled. -Since he is doing so much more physical work than his usual when he is at work, we discussed him decreasing his short acting as well as he might have to decrease his long-acting if he knows he is going to go up the outside all day working in the yard and physically exerting himself.  Long discussion was had regarding this and patient verbally confirmed that he understood how to modify his insulin injections due to his exercise habits -Refill given -  insulin degludec (TRESIBA FLEXTOUCH) 100 UNIT/ML SOPN FlexTouch Pen; Inject 0.3 mLs (30 Units total) into the skin at bedtime.  Dispense: 10 pen; Refill: 5  2. Hypertension associated with diabetes (HCC) Controlled, continue losartan. -Encouraged patient to buy a Omron 3 at [employer] or use a relatives such as his mother's whom he ess lives with/sees daily.  3. Hypothyroidism, unspecified type TSH well controlled.  Patient remains on Synthroid.  No complaints.  4. Vitamin D insufficiency Encourage patient to take his vitamin D tablets daily, as he states he sometimes forgets.  5.  Health counseling -Discussion had regarding covid-19 and social distancing etc.  All questions were answered.  I discussed the assessment and treatment plan with the patient. The patient was provided an opportunity to ask questions and all were answered. The patient agreed with the plan and demonstrated an understanding of the instructions.   The patient was advised to call back or seek an in-person evaluation if the symptoms worsen or if the condition fails to improve as anticipated.    Meds  ordered this encounter  Medications  . insulin degludec (TRESIBA FLEXTOUCH) 100 UNIT/ML SOPN FlexTouch Pen    Sig: Inject 0.3 mLs (30 Units total) into the skin at bedtime.    Dispense:  10 pen    Refill:  5    Medications Discontinued During This Encounter  Medication Reason  . insulin degludec (TRESIBA FLEXTOUCH) 100 UNIT/ML SOPN FlexTouch Pen Reorder     Gross side effects, risk and benefits, and alternatives of medications and treatment plan in general discussed with patient.  Patient is aware that all medications have potential side effects and we are unable to predict every side effect or drug-drug interaction that may occur.   Patient was strongly encouraged to call with any questions or concerns they may have concerns.    Expresses verbal understanding and consents to current  therapy and treatment regimen.  No barriers to understanding were identified.  Red flag symptoms and signs discussed in detail.  Patient expressed understanding regarding what to do in case of emergency\urgent symptoms  Please see AVS handed out to patient at the end of our visit for further patient instructions/ counseling done pertaining to today's office visit.  I provided 18 minutes 38 seconds of non-face-to-face time during this encounter.     Thomasene Lot, DO

## 2019-06-16 ENCOUNTER — Ambulatory Visit: Payer: BC Managed Care – PPO | Admitting: Family Medicine

## 2019-06-17 ENCOUNTER — Encounter: Payer: Self-pay | Admitting: Family Medicine

## 2019-06-17 ENCOUNTER — Other Ambulatory Visit: Payer: Self-pay

## 2019-06-17 ENCOUNTER — Ambulatory Visit (INDEPENDENT_AMBULATORY_CARE_PROVIDER_SITE_OTHER): Payer: BC Managed Care – PPO | Admitting: Family Medicine

## 2019-06-17 VITALS — BP 127/80 | HR 73 | Temp 98.7°F | Resp 16 | Ht 76.0 in | Wt 271.0 lb

## 2019-06-17 DIAGNOSIS — E781 Pure hyperglyceridemia: Secondary | ICD-10-CM

## 2019-06-17 DIAGNOSIS — E559 Vitamin D deficiency, unspecified: Secondary | ICD-10-CM | POA: Diagnosis not present

## 2019-06-17 DIAGNOSIS — R7989 Other specified abnormal findings of blood chemistry: Secondary | ICD-10-CM | POA: Diagnosis not present

## 2019-06-17 DIAGNOSIS — E109 Type 1 diabetes mellitus without complications: Secondary | ICD-10-CM

## 2019-06-17 DIAGNOSIS — E039 Hypothyroidism, unspecified: Secondary | ICD-10-CM | POA: Diagnosis not present

## 2019-06-17 DIAGNOSIS — E1159 Type 2 diabetes mellitus with other circulatory complications: Secondary | ICD-10-CM

## 2019-06-17 DIAGNOSIS — Z0000 Encounter for general adult medical examination without abnormal findings: Secondary | ICD-10-CM

## 2019-06-17 DIAGNOSIS — I152 Hypertension secondary to endocrine disorders: Secondary | ICD-10-CM

## 2019-06-17 DIAGNOSIS — I10 Essential (primary) hypertension: Secondary | ICD-10-CM

## 2019-06-17 LAB — POCT UA - MICROALBUMIN
Albumin/Creatinine Ratio, Urine, POC: <30
Creatinine, POC: 200 mg/dL
Microalbumin Ur, POC: 10 mg/L

## 2019-06-17 MED ORDER — TRESIBA FLEXTOUCH 100 UNIT/ML ~~LOC~~ SOPN: 30.0000 [IU] | PEN_INJECTOR | Freq: Every day | SUBCUTANEOUS | 1 refills | Status: DC

## 2019-06-17 MED ORDER — LOSARTAN POTASSIUM 50 MG PO TABS: 50.0000 mg | ORAL_TABLET | Freq: Every day | ORAL | 1 refills | Status: DC

## 2019-06-17 NOTE — Patient Instructions (Signed)
Less than 130/80    Diabetes Mellitus and Standards of Medical Care  Managing diabetes (diabetes mellitus) can be complicated. Your diabetes treatment may be managed by a team of health care providers, including:  A diet and nutrition specialist (registered dietitian).  A nurse.  A certified diabetes educator (CDE).  A diabetes specialist (endocrinologist).  An eye doctor.  A primary care provider.  A dentist.  Your health care providers follow a schedule in order to help you get the best quality of care. The following schedule is a general guideline for your diabetes management plan. Your health care providers may also give you more specific instructions.  HbA1c (hemoglobin A1c) test This test provides information about blood sugar (glucose) control over the previous 2-3 months. It is used to check whether your diabetes management plan needs to be adjusted.  If you are meeting your treatment goals, this test is done at least 2 times a year.  If you are not meeting treatment goals or if your treatment goals have changed, this test is done 4 times a year.  Blood pressure test  This test is done at every routine medical visit. For most people, the goal is less than 130/80. Ask your health care provider what your goal blood pressure should be.  Dental and eye exams  Visit your dentist two times a year.  If you have type 1 diabetes, get an eye exam 3-5 years after you are diagnosed, and then once a year after your first exam. ? If you were diagnosed with type 1 diabetes as a child, get an eye exam when you are age 40 or older and have had diabetes for 3-5 years. After the first exam, you should get an eye exam once a year.  If you have type 2 diabetes, have an eye exam as soon as you are diagnosed, and then once a year after your first exam.  Foot care exam  Visual foot exams are done at every routine medical visit. The exams check for cuts, bruises, redness, blisters,  sores, or other problems with the feet.  A complete foot exam is done by your health care provider once a year. This exam includes an inspection of the structure and skin of your feet, and a check of the pulses and sensation in your feet. ? Type 1 diabetes: Get your first exam 3-5 years after diagnosis. ? Type 2 diabetes: Get your first exam as soon as you are diagnosed.  Check your feet every day for cuts, bruises, redness, blisters, or sores. If you have any of these or other problems that are not healing, contact your health care provider.  Kidney function test (urine microalbumin)  This test is done once a year. ? Type 1 diabetes: Get your first test 5 years after diagnosis. ? Type 2 diabetes: Get your first test as soon as you are diagnosed._  If you have chronic kidney disease (CKD), get a serum creatinine and estimated glomerular filtration rate (eGFR) test once a year.  Lipid profile (cholesterol, HDL, LDL, triglycerides)  This test should be done when you are diagnosed with diabetes, and every 5 years after the first test. If you are on medicines to lower your cholesterol, you may need to get this test done every year. ? The goal for LDL is less than 100 mg/dL (5.5 mmol/L). If you are at high risk, the goal is less than 70 mg/dL (3.9 mmol/L). ? The goal for HDL is 40 mg/dL (2.2 mmol/L)  for men and 50 mg/dL(2.8 mmol/L) for women. An HDL cholesterol of 60 mg/dL (3.3 mmol/L) or higher gives some protection against heart disease. ? The goal for triglycerides is less than 150 mg/dL (8.3 mmol/L).  Immunizations  The yearly flu (influenza) vaccine is recommended for everyone 6 months or older who has diabetes.  The pneumonia (pneumococcal) vaccine is recommended for everyone 2 years or older who has diabetes. If you are 43 or older, you may get the pneumonia vaccine as a series of two separate shots.  The hepatitis B vaccine is recommended for adults shortly after they have been  diagnosed with diabetes.  The Tdap (tetanus, diphtheria, and pertussis) vaccine should be given: ? According to normal childhood vaccination schedules, for children. ? Every 10 years, for adults who have diabetes.  The shingles vaccine is recommended for people who have had chicken pox and are 50 years or older.  Mental and emotional health  Screening for symptoms of eating disorders, anxiety, and depression is recommended at the time of diagnosis and afterward as needed. If your screening shows that you have symptoms (you have a positive screening result), you may need further evaluation and be referred to a mental health care provider.  Diabetes self-management education  Education about how to manage your diabetes is recommended at diagnosis and ongoing as needed.  Treatment plan  Your treatment plan will be reviewed at every medical visit.  Summary  Managing diabetes (diabetes mellitus) can be complicated. Your diabetes treatment may be managed by a team of health care providers.  Your health care providers follow a schedule in order to help you get the best quality of care.  Standards of care including having regular physical exams, blood tests, blood pressure monitoring, immunizations, screening tests, and education about how to manage your diabetes.  Your health care providers may also give you more specific instructions based on your individual health.      Type 2 Diabetes Mellitus, Self Care, Adult Caring for yourself after you have been diagnosed with type 2 diabetes (type 2 diabetes mellitus) means keeping your blood sugar (glucose) under control with a balance of:  Nutrition.  Exercise.  Lifestyle changes.  Medicines or insulin, if necessary.  Support from your team of health care providers and others.  The following information explains what you need to know to manage your diabetes at home. What do I need to do to manage my blood glucose?  Check your  blood glucose every day, as often as told by your health care provider.  Contact your health care provider if your blood glucose is above your target for 2 tests in a row.  Have your A1c (hemoglobin A1c) level checked at least two times a year, or as often as told by your health care provider. Your health care provider will set individualized treatment goals for you. Generally, the goal of treatment is to maintain the following blood glucose levels:  Before meals (preprandial): 80-130 mg/dL (4.4-7.2 mmol/L).  After meals (postprandial): below 180 mg/dL (10 mmol/L).  A1c level: less than 7%.  What do I need to know about hyperglycemia and hypoglycemia? What is hyperglycemia? Hyperglycemia, also called high blood glucose, occurs when blood glucose is too high.Make sure you know the early signs of hyperglycemia, such as:  Increased thirst.  Hunger.  Feeling very tired.  Needing to urinate more often than usual.  Blurry vision.  What is hypoglycemia? Hypoglycemia, also called low blood glucose, occurswith a blood glucose level at  or below 70 mg/dL (3.9 mmol/L). The risk for hypoglycemia increases during or after exercise, during sleep, during illness, and when skipping meals or not eating for a long time (fasting). It is important to know the symptoms of hypoglycemia and treat it right away. Always have a 15-gram rapid-acting carbohydrate snack with you to treat low blood glucose. Family members and close friends should also know the symptoms and should understand how to treat hypoglycemia, in case you are not able to treat yourself. What are the symptoms of hypoglycemia? Hypoglycemia symptoms can include:  Hunger.  Anxiety.  Sweating and feeling clammy.  Confusion.  Dizziness or feeling light-headed.  Sleepiness.  Nausea.  Increased heart rate.  Headache.  Blurry vision.  Seizure.  Nightmares.  Tingling or numbness around the mouth, lips, or tongue.  A change  in speech.  Decreased ability to concentrate.  A change in coordination.  Restless sleep.  Tremors or shakes.  Fainting.  Irritability.  How do I treat hypoglycemia?  If you are alert and able to swallow safely, follow the 15:15 rule:  Take 15 grams of a rapid-acting carbohydrate. Rapid-acting options include: ? 1 tube of glucose gel. ? 3 glucose pills. ? 6-8 pieces of hard candy. ? 4 oz (120 mL) of fruit juice. ? 4 oz (120 mL) of regular (not diet) soda.  Check your blood glucose 15 minutes after you take the carbohydrate.  If the repeat blood glucose level is still at or below 70 mg/dL (3.9 mmol/L), take 15 grams of a carbohydrate again.  If your blood glucose level does not increase above 70 mg/dL (3.9 mmol/L) after 3 tries, seek emergency medical care.  After your blood glucose level returns to normal, eat a meal or a snack within 1 hour.  How do I treat severe hypoglycemia? Severe hypoglycemia is when your blood glucose level is at or below 54 mg/dL (3 mmol/L). Severe hypoglycemia is an emergency. Do not wait to see if the symptoms will go away. Get medical help right away. Call your local emergency services (911 in the [place] Do not drive yourself to the hospital. If you have severe hypoglycemia and you cannot eat or drink, you may need an injection of glucagon. A family member or close friend should learn how to check your blood glucose and how to give you a glucagon injection. Ask your health care provider if you need to have an emergency glucagon injection kit available. Severe hypoglycemia may need to be treated in a hospital. The treatment may include getting glucose through an IV tube. You may also need treatment for the cause of your hypoglycemia. Can having diabetes put me at risk for other conditions? Having diabetes can put you at risk for other long-term (chronic) conditions, such as heart disease and kidney disease. Your health care provider may prescribe  medicines to help prevent complications from diabetes. These medicines may include:  Aspirin.  Medicine to lower cholesterol.  Medicine to control blood pressure.  What else can I do to manage my diabetes? Take your diabetes medicines as told  If your health care provider prescribed insulin or diabetes medicines, take them every day.  Do not run out of insulin or other diabetes medicines that you take. Plan ahead so you always have these available.  If you use insulin, adjust your dosage based on how physically active you are and what foods you eat. Your health care provider will tell you how to adjust your dosage. Make healthy food choices  The things that you eat and drink affect your blood glucose and your insulin dosage. Making good choices helps to control your diabetes and prevent other health problems. A healthy meal plan includes eating lean proteins, complex carbohydrates, fresh fruits and vegetables, low-fat dairy products, and healthy fats. Make an appointment to see a diet and nutrition specialist (registered dietitian) to help you create an eating plan that is right for you. Make sure that you:  Follow instructions from your health care provider about eating or drinking restrictions.  Drink enough fluid to keep your urine clear or pale yellow.  Eat healthy snacks between nutritious meals.  Track the carbohydrates that you eat. Do this by reading food labels and learning the standard serving sizes of foods.  Follow your sick day plan whenever you cannot eat or drink as usual. Make this plan in advance with your health care provider.  Stay active  Exercise regularly, as told by your health care provider. This may include:  Stretching and doing strength exercises, such as yoga or weightlifting, at least 2 times a week.  Doing at least 150 minutes of moderate-intensity or vigorous-intensity exercise each week. This could be brisk walking, biking, or water aerobics. ?  Spread out your activity over at least 3 days of the week. ? Do not go more than 2 days in a row without doing some kind of physical activity.  When you start a new exercise or activity, work with your health care provider to adjust your insulin, medicines, or food intake as needed. Make healthy lifestyle choices  Do not use any tobacco products, such as cigarettes, chewing tobacco, and e-cigarettes. If you need help quitting, ask your health care provider.  If your health care provider says that alcohol is safe for you, limit alcohol intake to no more than 1 drink per day for nonpregnant women and 2 drinks per day for men. One drink equals 12 oz of beer, 5 oz of wine, or 1 oz of hard liquor.  Learn to manage stress. If you need help with this, ask your health care provider. Care for your body   Keep your immunizations up to date. In addition to getting vaccinations as told by your health care provider, it is recommended that you get vaccinated against the following illnesses: ? The flu (influenza). Get a flu shot every year. ? Pneumonia. ? Hepatitis B.  Schedule an eye exam soon after your diagnosis, and then one time every year after that.  Check your skin and feet every day for cuts, bruises, redness, blisters, or sores. Schedule a foot exam with your health care provider once every year.  Brush your teeth and gums two times a day, and floss at least one time a day. Visit your dentist at least once every 6 months.  Maintain a healthy weight. General instructions  Take over-the-counter and prescription medicines only as told by your health care provider.  Share your diabetes management plan with people in your workplace, school, and household.  Check your urine for ketones when you are ill and as told by your health care provider.  Ask your health care provider: ? Do I need to meet with a diabetes educator? ? Where can I find a support group for people with diabetes?  Carry a  medical alert card or wear medical alert jewelry.  Keep all follow-up visits as told by your health care provider. This is important. Where to find more information: For more information about diabetes, visit:  American Diabetes Association (ADA): www.diabetes.org  American Association of Diabetes Educators (AADE): www.diabeteseducator.org/patient-resources  This information is not intended to replace advice given to you by your health care provider. Make sure you discuss any questions you have with your health care provider. Document Released: 02/07/2016 Document Revised: 03/23/2016 Document Reviewed: 11/19/2015 Elsevier Interactive Patient Education  2017 [employer].      Blood Glucose Monitoring, Adult Monitoring your blood sugar (glucose) helps you manage your diabetes. It also helps you and your health care provider determine how well your diabetes management plan is working. Blood glucose monitoring involves checking your blood glucose as often as directed, and keeping a record (log) of your results over time. Why should I monitor my blood glucose? Checking your blood glucose regularly can:  Help you understand how food, exercise, illnesses, and medicines affect your blood glucose.  Let you know what your blood glucose is at any time. You can quickly tell if you are having low blood glucose (hypoglycemia) or high blood glucose (hyperglycemia).  Help you and your health care provider adjust your medicines as needed.  When should I check my blood glucose? Follow instructions from your health care provider about how often to check your blood glucose.   This may depend on:  The type of diabetes you have.  How well-controlled your diabetes is.  Medicines you are taking.  If you have type 1 diabetes:  Check your blood glucose at least 2 times a day.  Also check your blood glucose: ? Before every insulin injection. ? Before and after exercise. ? Between meals. ? 2  hours after a meal. ? Occasionally between 2:00 a.m. and 3:00 a.m., as directed. ? Before potentially dangerous tasks, like driving or using heavy machinery. ? At bedtime.  You may need to check your blood glucose more often, up to 6-10 times a day: ? If you use an insulin pump. ? If you need multiple daily injections (MDI). ? If your diabetes is not well-controlled. ? If you are ill. ? If you have a history of severe hypoglycemia. ? If you have a history of not knowing when your blood glucose is getting low (hypoglycemia unawareness).  If you have type 2 diabetes:  If you take insulin or other diabetes medicines, check your blood glucose at least 2 times a day.  If you are on intensive insulin therapy, check your blood glucose at least 4 times a day. Occasionally, you may also need to check between 2:00 a.m. and 3:00 a.m., as directed.  Also check your blood glucose: ? Before and after exercise. ? Before potentially dangerous tasks, like driving or using heavy machinery.  You may need to check your blood glucose more often if: ? Your medicine is being adjusted. ? Your diabetes is not well-controlled. ? You are ill.  What is a blood glucose log?  A blood glucose log is a record of your blood glucose readings. It helps you and your health care provider: ? Look for patterns in your blood glucose over time. ? Adjust your diabetes management plan as needed.  Every time you check your blood glucose, write down your result and notes about things that may be affecting your blood glucose, such as your diet and exercise for the day.  Most glucose meters store a record of glucose readings in the meter. Some meters allow you to download your records to a computer. How do I check my blood glucose? Follow these steps to get accurate readings of  your blood glucose: Supplies needed   Blood glucose meter.  Test strips for your meter. Each meter has its own strips. You must use the strips  that come with your meter.  A needle to prick your finger (lancet). Do not use lancets more than once.  A device that holds the lancet (lancing device).  A journal or log book to write down your results.  Procedure  Wash your hands with soap and water.  Prick the side of your finger (not the tip) with the lancet. Use a different finger each time.  Gently rub the finger until a small drop of blood appears.  Follow instructions that come with your meter for inserting the test strip, applying blood to the strip, and using your blood glucose meter.  Write down your result and any notes.  Alternative testing sites  Some meters allow you to use areas of your body other than your finger (alternative sites) to test your blood.  If you think you may have hypoglycemia, or if you have hypoglycemia unawareness, do not use alternative sites. Use your finger instead.  Alternative sites may not be as accurate as the fingers, because blood flow is slower in these areas. This means that the result you get may be delayed, and it may be different from the result that you would get from your finger.  The most common alternative sites are: ? Forearm. ? Thigh. ? Palm of the hand.  Additional tips  Always keep your supplies with you.  If you have questions or need help, all blood glucose meters have a 24-hour "hotline" number that you can call. You may also contact your health care provider.  After you use a few boxes of test strips, adjust (calibrate) your blood glucose meter by following instructions that came with your meter.    The American Diabetes Association suggests the following targets for most nonpregnant adults with diabetes.  More or less stringent glycemic goals may be appropriate for each individual.  A1C: Less than 7% A1C may also be reported as eAG: Less than 154 mg/dl Before a meal (preprandial plasma glucose): 80-130 mg/dl 1-2 hours after beginning of the meal  (Postprandial plasma glucose)*: Less than 180 mg/dl  *Postprandial glucose may be targeted if A1C goals are not met despite reaching preprandial glucose goals.   GOALS in short:  The goals are for the Hgb A1C to be less than 7.0 & blood pressure to be less than 130/80.    It is recommended that all diabetics are educated on and follow a healthy diabetic diet, exercise for 30 minutes 3-4 times per week (walking, biking, swimming, or machine), monitor blood glucose readings and bring that record with you to be reviewed at your next office visit.     You should be checking fasting blood sugars- especially after you eat poorly or eat really healthy, and also check 2 hour postprandial blood sugars after largest meal of the day.    Write these down and bring in your log at each office visit.    You will need to be seen every 3 months by the provider managing your Diabetes unless told otherwise by that provider.   You will need yearly eye exams from an eye specialist and foot exams to check the nerves of your feet.  Also, your urine should be checked yearly as well to make sure excess protein is not present.   If you are checking your blood pressure at home, please record it  and bring it to your next office visit.    Follow the Dietary Approaches to Stop Hypertension (DASH) diet (3 servings of fruit and vegetables daily, whole grains, low sodium, low-fat proteins).  See below.    Lastly, when it comes to your cholesterol, the goal is to have the HDL (good cholesterol) >40, and the LDL (bad cholesterol) <100.   It is recommended that you follow a heart healthy, low saturated and trans-fat diet and exercise for 30 minutes at least 5 times a week.     (( Check out the DASH diet = 1.5 Gram Low Sodium Diet   A 1.5 gram sodium diet restricts the amount of sodium in the diet to no more than 1.5 g or 1500 mg daily.  The American Heart Association recommends Americans over the age of 80 to consume no more  than 1500 mg of sodium each day to reduce the risk of developing high blood pressure.  Research also shows that limiting sodium may reduce heart attack and stroke risk.  Many foods contain sodium for flavor and sometimes as a preservative.  When the amount of sodium in a diet needs to be low, it is important to know what to look for when choosing foods and drinks.  The following includes some information and guidelines to help make it easier for you to adapt to a low sodium diet.    QUICK TIPS  Do not add salt to food.  Avoid convenience items and fast food.  Choose unsalted snack foods.  Buy lower sodium products, often labeled as "lower sodium" or "no salt added."  Check food labels to learn how much sodium is in 1 serving.  When eating at a restaurant, ask that your food be prepared with less salt or none, if possible.    READING FOOD LABELS FOR SODIUM INFORMATION  The nutrition facts label is a good place to find how much sodium is in foods. Look for products with no more than 400 mg of sodium per serving.  Remember that 1.5 g = 1500 mg.  The food label may also list foods as:  Sodium-free: Less than 5 mg in a serving.  Very low sodium: 35 mg or less in a serving.  Low-sodium: 140 mg or less in a serving.  Light in sodium: 50% less sodium in a serving. For example, if a food that usually has 300 mg of sodium is changed to become light in sodium, it will have 150 mg of sodium.  Reduced sodium: 25% less sodium in a serving. For example, if a food that usually has 400 mg of sodium is changed to reduced sodium, it will have 300 mg of sodium.    CHOOSING FOODS  Grains  Avoid: Salted crackers and snack items. Some cereals, including instant hot cereals. Bread stuffing and biscuit mixes. Seasoned rice or pasta mixes.  Choose: Unsalted snack items. Low-sodium cereals, oats, puffed wheat and rice, shredded wheat. English muffins and bread. Pasta.  Meats  Avoid: Salted, canned, smoked, spiced,  pickled meats, including fish and poultry. Bacon, ham, sausage, cold cuts, hot dogs, anchovies.  Choose: Low-sodium canned tuna and salmon. Fresh or frozen meat, poultry, and fish.  Dairy  Avoid: Processed cheese and spreads. Cottage cheese. Buttermilk and condensed milk. Regular cheese.  Choose: Milk. Low-sodium cottage cheese. Yogurt. Sour cream. Low-sodium cheese.  Fruits and Vegetables  Avoid: Regular canned vegetables. Regular canned tomato sauce and paste. Frozen vegetables in sauces. Olives. Angie Fava. Relishes. Sauerkraut.  Choose:  Low-sodium canned vegetables. Low-sodium tomato sauce and paste. Frozen or fresh vegetables. Fresh and frozen fruit.  Condiments  Avoid: Canned and packaged gravies. Worcestershire sauce. Tartar sauce. Barbecue sauce. Soy sauce. Steak sauce. Ketchup. Onion, garlic, and table salt. Meat flavorings and tenderizers.  Choose: Fresh and dried herbs and spices. Low-sodium varieties of mustard and ketchup. Lemon juice. Tabasco sauce. Horseradish.    SAMPLE 1.5 GRAM SODIUM MEAL PLAN:   Breakfast / Sodium (mg)  1 cup low-fat milk / 143 mg  1 whole-wheat English muffin / 240 mg  1 tbs heart-healthy margarine / 153 mg  1 hard-boiled egg / 139 mg  1 small orange / 0 mg  Lunch / Sodium (mg)  1 cup raw carrots / 76 mg  2 tbs no salt added peanut butter / 5 mg  2 slices whole-wheat bread / 270 mg  1 tbs jelly / 6 mg   cup red grapes / 2 mg  Dinner / Sodium (mg)  1 cup whole-wheat pasta / 2 mg  1 cup low-sodium tomato sauce / 73 mg  3 oz lean ground beef / 57 mg  1 small side salad (1 cup raw spinach leaves,  cup cucumber,  cup yellow bell pepper) with 1 tsp olive oil and 1 tsp red wine vinegar / 25 mg  Snack / Sodium (mg)  1 container low-fat vanilla yogurt / 107 mg  3 graham cracker squares / 127 mg  Nutrient Analysis  Calories: 1745  Protein: 75 g  Carbohydrate: 237 g  Fat: 57 g  Sodium: 1425 mg  Document Released: 10/16/2005 Document Revised:  06/28/2011 Document Reviewed: 01/17/2010  ExitCare Patient Information 2012 [place].))    This information is not intended to replace advice given to you by your health care provider. Make sure you discuss any questions you have with your health care provider. Document Released: 10/19/2003 Document Revised: 05/05/2016 Document Reviewed: 03/27/2016 Elsevier Interactive Patient Education  2017 [employer].

## 2019-06-17 NOTE — Progress Notes (Signed)
Impression and Recommendations:    1. Type 1 diabetes mellitus without complication (HCC)   2. Vitamin D insufficiency   3. Elevated TSH   4. Hypothyroidism, unspecified type   5. Hypertension associated with diabetes (HCC)   6. Hypertriglyceridemia, familial   7. Healthcare maintenance     - Need for fasting lab work today.  Type 1 Diabetes Mellitus w/out Complication - Stable at this time on current management. - Continue management as prescribed.  See med list. - Patient tolerating insulin well without complication.  Denies S-E  - Reviewed insulin management and sliding scale during appointment today. - Advised patient to never forget to eat, or he will risk low blood sugars. - Education provided about protection from low sugars. - With every meal, increase fiber and protein to help prevent both spikes and lows.  - Check FBS and 2 hours after the biggest meal of your day.  Keep log and bring in next OV for my review.   Also, if you ever feel poorly, please check your blood pressure and blood sugar, as one or the other could be the cause of your symptoms.  - Being a diabetic, you need yearly eye and foot exams. Make appt.for diabetic eye exam.  - Will continue to monitor.  Hypertension associated with Diabetes - Stable at this time. - Continue treatment plan as established.  See med list below. - Patient tolerating meds well without complication.  Denies S-E  - Prudent lifestyle habits such as low-sodium diet and engaging in a regular exercise program discussed with patient.  - Ambulatory BP monitoring encouraged. Keep log and bring in next OV.  Hypertriglyceridemia, familial - Need for re-check. - Discussed that if patient's cholesterol comes back abnormal, we will be aggressive with treating it. - Reviewed that patient will begin medications if indicated.  Patient agrees today.  - Extensive education was provided and all questions were answered.  - Dietary  changes such as low saturated & trans fat and low carb diets discussed with patient.  Encouraged regular exercise and weight loss when appropriate.   - Educational handouts provided at patient's desire.  - Will continue to monitor.  Vitamin D Insufficiency - Continue management as prescribed.  See med list. - Patient tolerating supplementation well without complication.  Denies S-E  - Need for re-check. - Will continue to monitor.  Elevated TSH, Hypothyroidism, Unspecified - Need for re-check. - Will continue to monitor.  Healthcare Maintenance - Advised patient to continue working toward exercising to improve overall mental, physical, and emotional health.    - Encouraged patient to engage in daily physical activity, especially a formal exercise routine.  Recommended that the patient eventually strive for at least 150 minutes of moderate cardiovascular activity per week according to guidelines established by the [facility].   - Healthy dietary habits encouraged, including low-carb, and high amounts of lean protein in diet.   - American Heart Association guidelines for healthy diet, basically Mediterranean diet, and exercise guidelines of 30 minutes 5 days per week or more discussed in detail.  - Reviewed the "spokes of the wheel" of mood and health management.  Stressed the importance of ongoing prudent habits, including regular exercise, appropriate sleep hygiene, healthful dietary habits, and prayer/meditation to relax.  - Patient should also consume adequate amounts of water.  - Health counseling performed.  All questions answered.  Recommendations - Follow up every 4 months for routine chronic health maintenance and Diabetes Management.  Orders Placed This Encounter  Procedures  . CBC with Differential/Platelet  . Comprehensive metabolic panel  . Hemoglobin A1c  . T4, free  . T3  . VITAMIN D 25 Hydroxy (Vit-D Deficiency, Fractures)  . TSH  . Hemoglobin A1c  . Lipid panel   . POCT UA - Microalbumin    Meds ordered this encounter  Medications  . insulin degludec (TRESIBA FLEXTOUCH) 100 UNIT/ML SOPN FlexTouch Pen    Sig: Inject 0.3 mLs (30 Units total) into the skin at bedtime.    Dispense:  10 pen    Refill:  1  . losartan (COZAAR) 50 MG tablet    Sig: Take 1 tablet (50 mg total) by mouth daily.    Dispense:  90 tablet    Refill:  1    Medications Discontinued During This Encounter  Medication Reason  . levothyroxine (SYNTHROID, LEVOTHROID) 50 MCG tablet Error  . losartan (COZAAR) 50 MG tablet Reorder  . insulin degludec (TRESIBA FLEXTOUCH) 100 UNIT/ML SOPN FlexTouch Pen Reorder     Gross side effects, risk and benefits, and alternatives of medications and treatment plan in general discussed with patient.  Patient is aware that all medications have potential side effects and we are unable to predict every side effect or drug-drug interaction that may occur.   Patient will call with any questions prior to using medication if they have concerns.    Expresses verbal understanding and consents to current therapy and treatment regimen.  No barriers to understanding were identified.  Red flag symptoms and signs discussed in detail.  Patient expressed understanding regarding what to do in case of emergency\urgent symptoms  Please see AVS handed out to patient at the end of our visit for further patient instructions/ counseling done pertaining to today's office visit.   Return for DM, HTN follow up every 4 mo.     Note:  This note was prepared with assistance of Dragon voice recognition software. Occasional wrong-word or sound-a-like substitutions may have occurred due to the inherent limitations of voice recognition software.   This document serves as a record of services personally performed by Mellody Dance, DO. It was created on her behalf by Toni Amend, a trained medical scribe. The creation of this record is based on the scribe's personal  observations and the provider's statements to them.   I have reviewed the above medical documentation for accuracy and completeness and I concur.  Mellody Dance, DO 06/18/2019 7:18 PM      --------------------------------------------------------------------------------------------------------------------------------------------------------------------------------------------------------------------------------------------    Subjective:     HPI: Cristian Kennedy is a 23 y.o. male who [facility] for issues as discussed below.  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  Confirms his mood has been good.  DM HPI:  -  He has been working on diet and exercise for diabetes  Medication compliance - Continues on Tresiba 30 units at night at bedtime 10-13 units of short-acting for meals, a little bit more in the evenings.  Following sliding scale and meds are working.  Lowest was a 67 when he came in from working.  Says he could "feel it in his legs."  Notes "I try and carb up when I'm hitting lows, because it's better to be high, any day."  Says he usually has a sausage biscuit or banana to "kickstart it" in the mornings.  Home fasting glucose readings: says his sugars have been good, in the low 100's, 107-109 before breakfast.  Says "most of my numbers in the morning float around 90-100, a couple lows."  Says there was one week where his numbers were higher.  He left his insulin in his truck one week and thinks it became  ineffective.  Says he felt like "something wasn't right" over that one week.  2 hr PP: not checking   Denies polyuria/polydipsia.  Denies hypo/ hyperglycemia symptoms  Last diabetic eye exam was No results found for: HMDIABEYEEXA  Last A1C in the office was:  Lab Results  Component Value Date   HGBA1C 6.2 (A) 09/30/2018   HGBA1C 6.6 (H) 06/05/2018   HGBA1C 6.4 (A) 04/18/2018    Lab Results  Component Value Date   MICROALBUR 10 06/17/2019   LDLCALC 34 06/05/2018   CREATININE 0.99 06/05/2018    Wt Readings from Last 3 Encounters:  06/17/19 271 lb (122.9 kg)  01/24/19 254 lb 9.6 oz (115.5 kg)  09/30/18 259 lb (117.5 kg)    BP Readings from Last 3 Encounters:  06/17/19 127/80  01/24/19 130/78  09/30/18 130/84    1. HTN HPI:  -  His blood pressure has been controlled at home.  Pt is checking it at home on the [employer].   At rest, states that his BP runs around 120's-130 while resting, with bottom number of 75-85.  - Patient reports good compliance with blood pressure medications  - Denies medication S-E   - Smoking Status noted   - He denies new onset of: chest pain, exercise intolerance, shortness of breath, dizziness, visual changes, headache, lower extremity swelling or claudication.   Last 3 blood pressure readings in our office are as follows: BP Readings from Last 3 Encounters:  06/17/19 127/80  01/24/19 130/78  09/30/18 130/84    Filed Weights   06/17/19 1617  Weight: 271 lb (122.9 kg)        Wt Readings from Last 3 Encounters:  06/17/19 271 lb (122.9 kg)  01/24/19 254 lb 9.6 oz (115.5 kg)  09/30/18 259 lb (117.5 kg)   BP Readings from Last 3 Encounters:  06/17/19 127/80  01/24/19 130/78  09/30/18 130/84   Pulse Readings from Last 3 Encounters:  06/17/19 73  01/24/19 68  09/30/18 65   BMI Readings from Last 3 Encounters:  06/17/19 32.99 kg/m  01/24/19 30.99 kg/m  09/30/18 31.53 kg/m     Patient Care Team    Relationship  Specialty Notifications Start End  Thomasene Lotpalski, Dorthie Santini, DO PCP - General Family Medicine  06/07/16      Patient Active Problem List   Diagnosis Date Noted  . Hypertension associated with diabetes (HCC) 01/24/2019  . Class 1 obesity with serious comorbidity in adult 09/30/2018  . Borderline hypertension 02/21/2018  . High risk medications (not anticoagulants) long-term use 02/21/2018  . h/o Hypothyroidism 05/07/2017  . Vitamin D insufficiency 06/25/2016  . Elevated TSH 06/25/2016  . Type 1 diabetes mellitus without complication (HCC) 06/13/2016  . Hypertriglyceridemia, familial 06/13/2016  . Family history of early CAD- father died AMI  age 24 06/07/2016  . Family history of essential hypertension 06/07/2016  . Family history of diabetes mellitus (DM) 06/07/2016    Past Medical history, Surgical history, Family history, Social history, Allergies and Medications have been entered into the medical record, reviewed and changed as needed.    Current Meds  Medication Sig  . Cholecalciferol (VITAMIN D3) 5000 units TABS 5,000 IU OTC vitamin D3 daily.  . insulin degludec (TRESIBA FLEXTOUCH) 100 UNIT/ML SOPN FlexTouch Pen Inject 0.3 mLs (30 Units total) into the skin at bedtime.  Marland Kitchen. losartan (COZAAR) 50 MG tablet Take 1 tablet (50 mg total) by mouth daily.  Marland Kitchen. NOVOLOG FLEXPEN 100 UNIT/ML FlexPen 10-20 units 3 times daily with meals  . [DISCONTINUED] insulin degludec (TRESIBA FLEXTOUCH) 100 UNIT/ML SOPN FlexTouch Pen Inject 0.3 mLs (30 Units total) into the skin at bedtime.  . [DISCONTINUED] levothyroxine (SYNTHROID, LEVOTHROID) 50 MCG tablet Take 50 mcg by mouth daily before breakfast.  . [DISCONTINUED] losartan (COZAAR) 50 MG tablet Take 1 tablet (50 mg total) by mouth daily.    Allergies:  No Known Allergies   Review of Systems:  A fourteen system review of systems was performed and found to be positive as per HPI.   Objective:   Blood pressure 127/80, pulse 73, temperature 98.7 F (37.1  C), resp. rate 16, height 6\' 4"  (1.93 m), weight 271 lb (122.9 kg), SpO2 99 %. Body mass index is 32.99 kg/m. General:  Well Developed, well nourished, appropriate for stated age.  Neuro:  Alert and oriented,  extra-ocular muscles intact  HEENT:  Normocephalic, atraumatic, neck supple, no carotid bruits appreciated  Skin:  no gross rash, warm, pink. Cardiac:  RRR, S1 S2 Respiratory:  ECTA B/L and A/P, Not using accessory muscles, speaking in full sentences- unlabored. Vascular:  Ext warm, no cyanosis apprec.; cap RF less 2 sec. Psych:  No HI/SI, judgement and insight good, Euthymic mood. Full Affect.

## 2019-06-18 LAB — COMPREHENSIVE METABOLIC PANEL
ALT: 20 IU/L (ref 0–44)
AST: 21 IU/L (ref 0–40)
Albumin/Globulin Ratio: 1.8 (ref 1.2–2.2)
Albumin: 4.8 g/dL (ref 4.1–5.2)
Alkaline Phosphatase: 67 IU/L (ref 39–117)
BUN/Creatinine Ratio: 14 (ref 9–20)
BUN: 14 mg/dL (ref 6–20)
Bilirubin Total: 0.5 mg/dL (ref 0.0–1.2)
CO2: 25 mmol/L (ref 20–29)
Calcium: 9.4 mg/dL (ref 8.7–10.2)
Chloride: 98 mmol/L (ref 96–106)
Creatinine, Ser: 1 mg/dL (ref 0.76–1.27)
GFR calc Af Amer: 121 mL/min/{1.73_m2} (ref >59)
GFR calc non Af Amer: 105 mL/min/{1.73_m2} (ref >59)
Globulin, Total: 2.7 g/dL (ref 1.5–4.5)
Glucose: 109 mg/dL — ABNORMAL HIGH (ref 65–99)
Potassium: 4.1 mmol/L (ref 3.5–5.2)
Sodium: 139 mmol/L (ref 134–144)
Total Protein: 7.5 g/dL (ref 6.0–8.5)

## 2019-06-18 LAB — LIPID PANEL
Chol/HDL Ratio: 2.2 ratio (ref 0.0–5.0)
Cholesterol, Total: 109 mg/dL (ref 100–199)
HDL: 49 mg/dL (ref >39)
LDL Calculated: 47 mg/dL (ref 0–99)
Triglycerides: 63 mg/dL (ref 0–149)
VLDL Cholesterol Cal: 13 mg/dL (ref 5–40)

## 2019-06-18 LAB — T3: T3, Total: 137 ng/dL (ref 71–180)

## 2019-06-18 LAB — CBC WITH DIFFERENTIAL/PLATELET
Basophils Absolute: 0 10*3/uL (ref 0.0–0.2)
Basos: 0 %
EOS (ABSOLUTE): 0.1 10*3/uL (ref 0.0–0.4)
Eos: 1 %
Hematocrit: 48.2 % (ref 37.5–51.0)
Hemoglobin: 16.3 g/dL (ref 13.0–17.7)
Immature Grans (Abs): 0 10*3/uL (ref 0.0–0.1)
Immature Granulocytes: 0 %
Lymphocytes Absolute: 2.3 10*3/uL (ref 0.7–3.1)
Lymphs: 34 %
MCH: 29.1 pg (ref 26.6–33.0)
MCHC: 33.8 g/dL (ref 31.5–35.7)
MCV: 86 fL (ref 79–97)
Monocytes Absolute: 0.4 10*3/uL (ref 0.1–0.9)
Monocytes: 7 %
Neutrophils Absolute: 3.9 10*3/uL (ref 1.4–7.0)
Neutrophils: 58 %
Platelets: 232 10*3/uL (ref 150–450)
RBC: 5.6 x10E6/uL (ref 4.14–5.80)
RDW: 12.8 % (ref 11.6–15.4)
WBC: 6.7 10*3/uL (ref 3.4–10.8)

## 2019-06-18 LAB — VITAMIN D 25 HYDROXY (VIT D DEFICIENCY, FRACTURES): Vit D, 25-Hydroxy: 31.2 ng/mL (ref 30.0–100.0)

## 2019-06-18 LAB — TSH: TSH: 3.54 u[IU]/mL (ref 0.450–4.500)

## 2019-06-18 LAB — HEMOGLOBIN A1C
Est. average glucose Bld gHb Est-mCnc: 148 mg/dL
Hgb A1c MFr Bld: 6.8 % — ABNORMAL HIGH (ref 4.8–5.6)

## 2019-06-18 LAB — T4, FREE: Free T4: 1.22 ng/dL (ref 0.82–1.77)

## 2019-06-30 NOTE — Progress Notes (Deleted)
   Subjective:    Patient ID: Cristian Kennedy, male    DOB: 25-Jan-1996, 23 y.o.   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 Hypertension associated with diabetes ([place]) 01/24/2019  . Class 1 obesity with serious comorbidity in adult 09/30/2018  . Borderline hypertension 02/21/2018  . High risk medications (not anticoagulants) long-term use 02/21/2018  . h/o Hypothyroidism 05/07/2017  . Vitamin D insufficiency 06/25/2016  . Elevated TSH 06/25/2016  . Type 1 diabetes mellitus without complication ([place]) 99/24/2683  . Hypertriglyceridemia, familial 06/13/2016  . Family history of early CAD- father died AMI age 58 07-05-16  . Family history of essential hypertension 2016/07/05  . Family history of diabetes mellitus (DM) Jul 05, 2016     Past Medical History:  Diagnosis Date  . Family history of diabetes mellitus (DM) 07-05-2016  . Family history of early CAD- father died AMI age 55 05-Jul-2016  . Family history of essential hypertension July 05, 2016  . Overweight (BMI 25.0-29.9) July 05, 2016     Past Surgical History:  Procedure Laterality Date  . TONSILLECTOMY AND ADENOIDECTOMY    . TYMPANOSTOMY TUBE PLACEMENT       Family History  Problem Relation Age of Onset  . Cancer Mother        leukemia  . Hypertension Mother   . Alcohol abuse Father   . Stroke Father   . Alcohol abuse Paternal Uncle   . Diabetes Maternal Grandmother   . Hypertension Maternal Grandmother   . Cancer Paternal Grandmother        stomach/liver  . Diabetes Paternal Grandmother   . Hypertension Paternal Grandmother   . Alcohol abuse Paternal Grandfather      Social History   Substance and Sexual Activity  Drug Use No     Social History   Substance and Sexual Activity  Alcohol Use No      Social History   Tobacco Use  Smoking Status Never Smoker  Smokeless Tobacco Never Used     Outpatient Encounter Medications as of 07/01/2019  Medication Sig  . Cholecalciferol (VITAMIN D3) 5000 units TABS 5,000 IU OTC vitamin D3 daily.  . insulin degludec (TRESIBA FLEXTOUCH) 100 UNIT/ML SOPN FlexTouch Pen Inject 0.3 mLs (30 Units total) into the skin at bedtime.  Marland Kitchen losartan (COZAAR) 50 MG tablet Take 1 tablet (50 mg total) by mouth daily.  Marland Kitchen NOVOLOG FLEXPEN 100 UNIT/ML FlexPen 10-20 units 3 times daily with meals   No facility-administered encounter medications on file as of 07/01/2019.     Allergies: Patient has no known allergies.  There is no height or weight on file to calculate BMI.  There were no vitals taken for this visit.     Review of Systems     Objective:   Physical Exam        Assessment & Plan:  No diagnosis found.  No problem-specific Assessment & Plan notes found for this encounter.    FOLLOW-UP:  No follow-ups on file.

## 2019-07-01 ENCOUNTER — Encounter: Payer: Self-pay | Admitting: Family Medicine

## 2019-07-01 ENCOUNTER — Ambulatory Visit: Payer: BC Managed Care – PPO | Admitting: Adult Health

## 2019-10-16 ENCOUNTER — Other Ambulatory Visit: Payer: Self-pay

## 2019-10-16 ENCOUNTER — Encounter: Payer: Self-pay | Admitting: Family Medicine

## 2019-10-16 ENCOUNTER — Ambulatory Visit (INDEPENDENT_AMBULATORY_CARE_PROVIDER_SITE_OTHER): Payer: BC Managed Care – PPO | Admitting: Family Medicine

## 2019-10-16 VITALS — Wt 255.0 lb

## 2019-10-16 DIAGNOSIS — E559 Vitamin D deficiency, unspecified: Secondary | ICD-10-CM | POA: Diagnosis not present

## 2019-10-16 DIAGNOSIS — I10 Essential (primary) hypertension: Secondary | ICD-10-CM

## 2019-10-16 DIAGNOSIS — E669 Obesity, unspecified: Secondary | ICD-10-CM

## 2019-10-16 DIAGNOSIS — Z79899 Other long term (current) drug therapy: Secondary | ICD-10-CM

## 2019-10-16 DIAGNOSIS — E1159 Type 2 diabetes mellitus with other circulatory complications: Secondary | ICD-10-CM

## 2019-10-16 DIAGNOSIS — E1069 Type 1 diabetes mellitus with other specified complication: Secondary | ICD-10-CM | POA: Insufficient documentation

## 2019-10-16 DIAGNOSIS — R7989 Other specified abnormal findings of blood chemistry: Secondary | ICD-10-CM

## 2019-10-16 DIAGNOSIS — Z789 Other specified health status: Secondary | ICD-10-CM | POA: Insufficient documentation

## 2019-10-16 LAB — POCT GLYCOSYLATED HEMOGLOBIN (HGB A1C): Hemoglobin A1C: 6.5 % — AB (ref 4.0–5.6)

## 2019-10-16 NOTE — Progress Notes (Signed)
Telehealth office visit note for Cristian Kennedy, D.O- at Primary Care at [facility]   I connected with current patient today and verified that I am speaking with the correct person using two identifiers.   . Location of the patient: Home . Location of the provider: Office Only the patient (+/- their family members at pt's discretion) and myself were participating in the encounter - This visit type was conducted due to national recommendations for restrictions regarding the COVID-19 Pandemic (e.g. social distancing) in an effort to limit this patient's exposure and mitigate transmission in our community.  This format is felt to be most appropriate for this patient at this time.   - The patient did not have access to video technology or had technical difficulties with video requiring transitioning to audio format only. - No physical exam could be performed with this format, beyond that communicated to [place] by the patient/ family members as noted.   - Additionally my office staff/ schedulers discussed with the patient that there may be a monetary charge related to this service, depending on their medical insurance.   The patient expressed understanding, and agreed to proceed.       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  Notes he has been relying on takeout typically during [place].  Doesn't eat out as much anymore because there aren't as many keto / low-carb friendly places.  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  Notes one of the coaches is a Type II diabetic, and the patient desired to join in starting keto to control his blood sugars as well.  Remarks that he did not start keto for weight loss, but he has lost weight in the process.  He has been weighing in the low 250's, 254.6 this morning.  HPI:   Diabetes Mellitus:  Had his A1c checked today at [facility], and  was at 6.5.  Has been on keto diet for four months.  Home glucose readings:  Prior to keto, his sugars had been floating around 100-120.  Now his sugars are running 90's-110.  Goes into further detail about doing "kind of a keto-type diet" with fellow coaches.  "I haven't been strict, so when I do eat my meals, I do take insulin if I have carbs."  Notes he learned really quick that without carbs or sugars, he shouldn't take insulin.  Notes he's saving a lot of money avoiding breads/carbs.  States eating more vegetables, cheese, proteins; trying to be careful with cheese because it's still a dairy product, but "for the most part, he eats eggs and a little bit of sausage in the morning, a salad at lunch with shredded cheese, bacon bits, ranch, and something like a stew for dinner."  Now and then he splurges on a burger.  For his meal in the morning, he includes some carbs to provide energy through the day, and his lunch sugars run around 130-140.  Notes at dinner, his sugars are 86-95.    He only takes insulin when he eats carbs.   - Patient reports good compliance with therapy plan: medication and/or lifestyle modification  - His denies acute concerns or problems related to treatment plan  - He denies new concerns.  Denies polyuria/polydipsia, hypo/ hyperglycemia symptoms.  Denies new onset of: chest pain, exercise intolerance, shortness of breath, dizziness, visual  changes, headache, lower extremity swelling or claudication.   Last A1C in the office was:  Lab Results  Component Value Date   HGBA1C 6.5 (A) 10/16/2019   HGBA1C 6.8 (H) 06/17/2019   HGBA1C 6.2 (A) 09/30/2018   Lab Results  Component Value Date   MICROALBUR 10 06/17/2019   LDLCALC 47 06/17/2019   CREATININE 1.00 06/17/2019   BP Readings from Last 3 Encounters:  06/17/19 127/80  01/24/19 130/78  09/30/18 130/84   Wt Readings from Last 3 Encounters:  10/16/19 255 lb (115.7 kg)  06/17/19 271 lb (122.9 kg)  01/24/19  254 lb 9.6 oz (115.5 kg)   HPI:  Hypertension:  - He hasn't been able to check his blood pressure because the nurse at school has been quarantined due to exposure to [place].  - Patient reports good compliance with medication and/or lifestyle modification.  - His denies acute concerns or problems related to treatment plan  - He denies new onset of: chest pain, exercise intolerance, shortness of breath, dizziness, visual changes, headache, lower extremity swelling or claudication.   Last 3 blood pressure readings in our office are as follows: BP Readings from Last 3 Encounters:  06/17/19 127/80  01/24/19 130/78  09/30/18 130/84   Filed Weights   10/16/19 1604  Weight: 255 lb (115.7 kg)   HPI:  Hyperlipidemia:  23 y.o. male here for cholesterol follow-up.   - Patient reports good compliance with treatment plan of:  medication and/ or lifestyle management.    - Patient denies any acute concerns or problems with management plan   - He denies new onset of: myalgias, arthralgias, increased fatigue more than normal, chest pains, exercise intolerance, shortness of breath, dizziness, visual changes, headache, lower extremity swelling or claudication.   Most recent cholesterol panel was:  Lab Results  Component Value Date   CHOL 109 06/17/2019   HDL 49 06/17/2019   LDLCALC 47 06/17/2019   TRIG 63 06/17/2019   CHOLHDL 2.2 06/17/2019   Hepatic Function Latest Ref Rng & Units 06/17/2019 06/05/2018 06/09/2016  Total Protein 6.0 - 8.5 g/dL 7.5 7.0 7.4  Albumin 4.1 - 5.2 g/dL 4.8 4.6 4.2  AST 0 - 40 IU/L 21 18 20   ALT 0 - 44 IU/L 20 15 23   Alk Phosphatase 39 - 117 IU/L 67 61 112  Total Bilirubin 0.0 - 1.2 mg/dL 0.5 0.6 0.6      No flowsheet data found.  Depression screen [facility] 2/9 10/16/2019 09/30/2018 04/18/2018 02/21/2018 01/09/2018  Decreased Interest 0 0 0 0 0  Down, Depressed, Hopeless 0 0 0 0 0  PHQ - 2 Score 0 0 0 0 0  Altered sleeping 0 0 0 0 -  Tired, decreased energy 0 0 0 0  -  Change in appetite 0 0 0 0 -  Feeling bad or failure about yourself  0 0 0 0 -  Trouble concentrating 0 0 0 0 -  Moving slowly or fidgety/restless 0 0 0 0 -  Suicidal thoughts 0 0 0 0 -  PHQ-9 Score 0 0 0 0 -  Difficult doing work/chores - Not difficult at all Not difficult at all Not difficult at all -      Impression and Recommendations:    1. Type 1 diabetes mellitus with other specified complication ([place])   2. Hypertension associated with diabetes ([place])   3. Type 1 diabetes mellitus with obesity ([place])   4. Patient on ketogenic diet   5. Vitamin D insufficiency  6. Elevated TSH   7. High risk medications (not anticoagulants) long-term use       Type 1 Diabetes Mellitus with Obesity - A1c 6.5 last check, down from 6.8 last measured 06/17/2019.  - A1c most recently is at goal. - Discussed that an A1c of 6.5 or lower is ideal control, with an A1c under 7.0 desirable.  - Pt will continue current treatment regimen.  See med list. - Advised patient to watch carefully for low blood sugars.  - Counseled patient on pathophysiology of disease and discussed various treatment options, which always includes dietary and lifestyle modification as first line.    - Importance of low carb, heart-healthy diet discussed with patient in addition to regular aerobic exercise of 76mn 5d/week or more.   - Check FBS and 2 hours after the biggest meal of your day.  Keep log and bring in next OV for my review.     - Also told patient if you ever feel poorly, please check your blood pressure and blood sugar, as one or the other could be the cause of your symptoms.  - Pt reminded about need for yearly eye and foot exams.  Told patient to make appt.for diabetic eye exam, CMAs here will do foot exams  - We will continue to monitor and re-check as discussed.  Hypertension associated with DM - Blood pressure currently is at goal. - Patient will continue current treatment regimen.  - Counseled  patient on pathophysiology of disease and discussed various treatment options, which always includes dietary and lifestyle modification as first line.   - Lifestyle changes such as dash and heart healthy diets and engaging in a regular exercise program discussed extensively with patient.   - Ambulatory blood pressure monitoring encouraged at least 3 times weekly.  Keep log and bring in every office visit.  Reminded patient that if they ever feel poorly in any way, to check their blood pressure and pulse.  - Handouts provided at patient's desire and/or told to go online at the [place] for further information  - We will continue to monitor  Patient on Ketogenic Diet Last FLP obtained 4 months prior: Triglycerides = 63, down from 68 prior. LDL = 47, up from 34 prior. HDL = 49, stable from 49 prior.  - Cholesterol levels at goal last check. - As patient is now being managed on keto diet, discussed need to prudently re-check fasting lipid panel. - Discussed that patient will not need to begin statin in future unless his cholesterol worsens.  - To maintain goal cholesterol, prudent dietary changes such as low saturated & trans fat diets for hyperlipidemia and low carb/ ketogenic diets for hypertriglyceridemia discussed with patient.    - Encouraged patient to follow AHA guidelines for regular exercise and also engage in weight loss if BMI above 25.   - We will continue to monitor and re-check as discussed.  Vitamin D Insufficiency - 31.2 last check. - Discussed goal range of 40-60. - Continue supplementation as established.  See med list. - Will continue to monitor and re-check as discussed.  Elevated TSH - TSH, T4, T3, all WNL last check. - Will continue to monitor and re-check as discussed.  Novel COVID-19 Counseling - Novel Covid -19 counseling done; all questions were answered.   - Current CDC / federal and [place] guidelines reviewed with patient   - Reminded pt of extreme importance of social distancing; wearing a mask when out in public; insensate  handwashing and cleaning of surfaces, avoiding unnecessary trips for shopping and avoiding ALL but emergency appts etc. - Told patient to be prepared, not scared; and be smart for the sake of others - Patient will call with any additional concerns  Recommendations - Return in 3 months with fasting blood work prior. - Will check Vitamin D, FLP.   - As part of my medical decision making, I reviewed the following data within the [place] History obtained from pt /family, CMA notes reviewed and incorporated if applicable, Labs reviewed, Radiograph/ tests reviewed if applicable and OV notes from prior OV's with me, as well as other specialists she/he has seen since seeing me last, were all reviewed and used in my medical decision making process today.    - Additionally, discussion had with patient regarding our treatment plan, and their biases/concerns about that plan were used in my medical decision making today.    - The patient agreed with the plan and demonstrated an understanding of the instructions.   No barriers to understanding were identified.    - Red flag symptoms and signs discussed in detail.  Patient expressed understanding regarding what to do in case of emergency\ urgent symptoms.   - The patient was advised to call back or seek an in-person evaluation if the symptoms worsen or if the condition fails to improve as anticipated.   Return for fasting lipid panel, Vitamin D in 3 months with OV 3-4 days later.    Orders Placed This Encounter  Procedures  . Lipid panel  . VITAMIN D 25 Hydroxy (Vit-D Deficiency, Fractures)  . POC Hemoglobin A1c (dx code Z13.1)    I provided 20+ minutes of non face-to-face time during this encounter.  Additional time was spent with charting and coordination of care before and after the actual visit commenced.   Note:  This note  was prepared with assistance of Dragon voice recognition software. Occasional wrong-word or sound-a-like substitutions may have occurred due to the inherent limitations of voice recognition software.  This document serves as a record of services personally performed by Cristian Dance, DO. It was created on her behalf by Toni Amend, a trained medical scribe. The creation of this record is based on the scribe's personal observations and the provider's statements to them.   This case required medical decision making of at least moderate complexity. The above documentation has been reviewed to be accurate and was completed by Marjory Sneddon, D.O.     Patient Care Team    Relationship Specialty Notifications Start End  Cristian Dance, DO PCP - General Family Medicine  June 12, 2016     -Vitals obtained; medications/ allergies reconciled;  personal medical, social, Sx etc.histories were updated by CMA, reviewed by me and are reflected in chart   Patient Active Problem List   Diagnosis Date Noted  . Type 1 diabetes mellitus with obesity ([place]) 10/16/2019  . Hypertension associated with diabetes ([place]) 01/24/2019  . Type 1 diabetes mellitus without complication ([place]) 70/78/6754  . Hypertriglyceridemia, familial 06/13/2016  . Family history of early CAD- father died AMI age 66 06-12-16  . Patient on ketogenic diet 10/16/2019  . High risk medications (not anticoagulants) long-term use 02/21/2018  . h/o Hypothyroidism 05/07/2017  . Elevated TSH 06/25/2016  . Borderline hypertension 02/21/2018  . Vitamin D insufficiency 06/25/2016  . Family history of essential hypertension 06/12/16  . Family history of diabetes mellitus (DM) 06/12/2016     Current Meds  Medication Sig  .  Cholecalciferol (VITAMIN D3) 5000 units TABS 5,000 IU OTC vitamin D3 daily.  . insulin degludec (TRESIBA FLEXTOUCH) 100 UNIT/ML SOPN FlexTouch Pen Inject 0.3 mLs (30 Units total) into the skin at bedtime.  Marland Kitchen  losartan (COZAAR) 50 MG tablet Take 1 tablet (50 mg total) by mouth daily.  Marland Kitchen NOVOLOG FLEXPEN 100 UNIT/ML FlexPen 10-20 units 3 times daily with meals     Allergies:  No Known Allergies   ROS:  See above HPI for pertinent positives and negatives   Objective:   Weight 255 lb (115.7 kg).  (if some vitals are omitted, this means that patient was UNABLE to obtain them even though they were asked to get them prior to OV today.  They were asked to call us at their earliest convenience with these once obtained. )  General: A & O * 3; sounds in no acute distress; in usual state of health.  Skin: Pt confirms warm and dry extremities and pink fingertips HEENT: Pt confirms lips non-cyanotic Chest: Patient confirms normal chest excursion and movement Respiratory: speaking in full sentences, no conversational dyspnea; patient confirms no use of accessory muscles Psych: insight appears good, mood- appears full

## 2019-11-27 ENCOUNTER — Telehealth: Payer: Self-pay | Admitting: Family Medicine

## 2019-11-27 NOTE — Telephone Encounter (Signed)
Patient 's mom called states he rcvd a bill for $215 frm [facility] for DOS 10/16/2019 for a telehealth w/ Dr. Val Eagle.  --Per mom BC EOB shows they paid for mostly everything but the actual Virtual OV.  -Forwarding message to Joselyn Glassman E to review & contact pt w/ findings.  --glh

## 2019-12-27 ENCOUNTER — Ambulatory Visit: Payer: BC Managed Care – PPO

## 2019-12-27 ENCOUNTER — Ambulatory Visit: Payer: BC Managed Care – PPO | Attending: Internal Medicine

## 2019-12-27 DIAGNOSIS — Z23 Encounter for immunization: Secondary | ICD-10-CM

## 2019-12-27 NOTE — Progress Notes (Signed)
   Covid-19 Vaccination Clinic  Name:  Cristian Kennedy    MRN: [id number] DOB: May 10, 1996  12/27/2019  [name] was observed post Covid-19 immunization for 15 minutes without incidence. He was provided with Vaccine Information Sheet and instruction to access the V-Safe system.   [name] was instructed to call 911 with any severe reactions post vaccine: Marland Kitchen Difficulty breathing  . Swelling of your face and throat  . A fast heartbeat  . A bad rash all over your body  . Dizziness and weakness    Immunizations Administered    Name Date Dose VIS Date Route   Moderna COVID-19 Vaccine 12/27/2019  3:32 PM 0.5 mL 09/30/2019 Intramuscular   Manufacturer: Moderna   Lot: [id number]   NDC: 16010-932-35

## 2020-01-24 ENCOUNTER — Ambulatory Visit: Payer: BC Managed Care – PPO | Attending: Internal Medicine

## 2020-01-24 ENCOUNTER — Ambulatory Visit: Payer: BC Managed Care – PPO

## 2020-01-24 DIAGNOSIS — Z23 Encounter for immunization: Secondary | ICD-10-CM

## 2020-01-24 NOTE — Progress Notes (Signed)
   Covid-19 Vaccination Clinic  Name:  Cristian Kennedy    MRN: [id number] DOB: Jan 01, 1996  01/24/2020  Mr. Bringle was observed post Covid-19 immunization for 15 minutes without incident. He was provided with Vaccine Information Sheet and instruction to access the V-Safe system.   Mr. Richison was instructed to call 911 with any severe reactions post vaccine: Marland Kitchen Difficulty breathing  . Swelling of face and throat  . A fast heartbeat  . A bad rash all over body  . Dizziness and weakness   Immunizations Administered    Name Date Dose VIS Date Route   Moderna COVID-19 Vaccine 01/24/2020  9:58 AM 0.5 mL 09/30/2019 Intramuscular   Manufacturer: [name]   Lot: [id number]   NDC: 99800-123-93

## 2020-04-08 ENCOUNTER — Other Ambulatory Visit: Payer: Self-pay | Admitting: Family Medicine

## 2020-04-08 ENCOUNTER — Other Ambulatory Visit: Payer: Self-pay | Admitting: Physician Assistant

## 2020-04-08 DIAGNOSIS — E1159 Type 2 diabetes mellitus with other circulatory complications: Secondary | ICD-10-CM

## 2020-04-08 DIAGNOSIS — E109 Type 1 diabetes mellitus without complications: Secondary | ICD-10-CM

## 2020-04-08 MED ORDER — LOSARTAN POTASSIUM 50 MG PO TABS: 50.0000 mg | ORAL_TABLET | Freq: Every day | ORAL | 0 refills | Status: DC

## 2020-04-08 MED ORDER — TRESIBA FLEXTOUCH 100 UNIT/ML ~~LOC~~ SOPN: 30.0000 [IU] | PEN_INJECTOR | Freq: Every day | SUBCUTANEOUS | 0 refills | Status: DC

## 2020-04-08 NOTE — Telephone Encounter (Signed)
Pt's mom called says pt is on last insulin pen & BP medication wishes a Rx to be called in .  Forwarding message to med asst for refill order of :  insulin degludec (TRESIBA FLEXTOUCH) 100 UNIT/ML SOPN FlexTouch Pen [[id number]]   Order Details Dose: 30 Units Route: Subcutaneous Frequency: Daily at bedtime  Dispense Quantity: 10 pen Refills: 1       Sig: Inject 0.3 mLs (30 Units total) into the skin at bedtime.

## 2020-04-08 NOTE — Telephone Encounter (Signed)
Pt called back needs refill on BP med as well :   losartan (COZAAR) 50 MG tablet [[id number]]   Order Details Dose: 50 mg Route: Oral Frequency: Daily  Dispense Quantity: 90 tablet Refills: 1       Sig: Take 1 tablet (50 mg total) by mouth daily.        Pt scheduled appt for 6/28/ labs & 7/1/ OV( Ins does NOT cover telehealth visits per patient must be in Office appts)  --Forwarding request that if approved to send refill order to :    [facility] [address] [place]), [place] - [id number] Phone:  [phone]  Fax:  [phone]     --glh

## 2020-04-08 NOTE — Telephone Encounter (Signed)
Refill sent to pharmacy. AS, CMA 

## 2020-04-26 ENCOUNTER — Other Ambulatory Visit: Payer: BC Managed Care – PPO

## 2020-04-26 ENCOUNTER — Other Ambulatory Visit: Payer: Self-pay

## 2020-04-26 ENCOUNTER — Other Ambulatory Visit: Payer: Self-pay | Admitting: Physician Assistant

## 2020-04-26 DIAGNOSIS — E559 Vitamin D deficiency, unspecified: Secondary | ICD-10-CM

## 2020-04-26 DIAGNOSIS — Z0000 Encounter for general adult medical examination without abnormal findings: Secondary | ICD-10-CM

## 2020-04-27 LAB — LIPID PANEL
Chol/HDL Ratio: 2.9 ratio (ref 0.0–5.0)
Cholesterol, Total: 138 mg/dL (ref 100–199)
HDL: 47 mg/dL (ref >39)
LDL Chol Calc (NIH): 72 mg/dL (ref 0–99)
Triglycerides: 106 mg/dL (ref 0–149)
VLDL Cholesterol Cal: 19 mg/dL (ref 5–40)

## 2020-04-27 LAB — VITAMIN D 25 HYDROXY (VIT D DEFICIENCY, FRACTURES): Vit D, 25-Hydroxy: 26 ng/mL — ABNORMAL LOW (ref 30.0–100.0)

## 2020-04-29 ENCOUNTER — Encounter: Payer: Self-pay | Admitting: Physician Assistant

## 2020-04-29 ENCOUNTER — Other Ambulatory Visit: Payer: Self-pay

## 2020-04-29 ENCOUNTER — Ambulatory Visit (INDEPENDENT_AMBULATORY_CARE_PROVIDER_SITE_OTHER): Payer: BC Managed Care – PPO | Admitting: Physician Assistant

## 2020-04-29 VITALS — BP 114/68 | HR 63 | Temp 98.1°F | Ht 76.0 in | Wt 257.1 lb

## 2020-04-29 DIAGNOSIS — E1069 Type 1 diabetes mellitus with other specified complication: Secondary | ICD-10-CM

## 2020-04-29 DIAGNOSIS — I152 Hypertension secondary to endocrine disorders: Secondary | ICD-10-CM

## 2020-04-29 DIAGNOSIS — E781 Pure hyperglyceridemia: Secondary | ICD-10-CM | POA: Diagnosis not present

## 2020-04-29 DIAGNOSIS — E1159 Type 2 diabetes mellitus with other circulatory complications: Secondary | ICD-10-CM | POA: Diagnosis not present

## 2020-04-29 DIAGNOSIS — I10 Essential (primary) hypertension: Secondary | ICD-10-CM

## 2020-04-29 DIAGNOSIS — E559 Vitamin D deficiency, unspecified: Secondary | ICD-10-CM | POA: Diagnosis not present

## 2020-04-29 LAB — POCT GLYCOSYLATED HEMOGLOBIN (HGB A1C): Hemoglobin A1C: 8.5 % — AB (ref 4.0–5.6)

## 2020-04-29 NOTE — Patient Instructions (Signed)

## 2020-04-29 NOTE — Progress Notes (Signed)
Established Patient Office Visit  Subjective:  Patient ID: Cristian Kennedy, male    DOB: 01/15/1996  Age: 24 y.o. MRN: [id number]  CC:  Chief Complaint  Patient presents with  . Diabetes  . Hypertension    HPI Cristian Kennedy presents for follow up on type 1 diabetes mellitus, hypertension, and vitamin D insufficiency.  Diabetes: Pt denies increased urination or thirst. Pt reports medication compliance. No hypoglycemic events. Checking glucose at home.  States he continues to watch his diet and has been diligent with monitoring carbohydrates and glucose.  Reports he started noticing an increase in his sugars after getting his second dose of covid-19 vaccine [name]) March 27, and were elevated for 4 weeks and FBS averaged 180s and recently started decreasing with FBS ranging 86-100s.  HTN: Pt denies new onset chest pain, palpitations, dizziness or lower extremity swelling. Taking medication as directed without side effects. Pt follows a low salt diet. He continues to stay as active as possible.  Vit D insufficiency: Last vitamin D 31.2.  Patient reports sometimes missing vitamin D dose.  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  Past Medical History:  Diagnosis Date  . Family history of diabetes mellitus (DM) 06/10/2016  . Family history of early CAD- father died AMI age 59 2016/06/10  . Family history of essential hypertension 10-Jun-2016  . Overweight (BMI 25.0-29.9) 06/10/16    Past Surgical History:  Procedure Laterality Date  . TONSILLECTOMY AND ADENOIDECTOMY    . TYMPANOSTOMY TUBE PLACEMENT      Family History  Problem Relation Age of Onset  . Cancer Mother        leukemia  . Hypertension Mother   . Alcohol abuse Father   . Stroke Father   . Alcohol abuse Paternal Uncle   . Diabetes Maternal Grandmother   . Hypertension Maternal Grandmother   . Cancer Paternal Grandmother        stomach/liver  . Diabetes Paternal  Grandmother   . Hypertension Paternal Grandmother   . Alcohol abuse Paternal Grandfather     Social History   Socioeconomic History  . Marital status: Single    Spouse name: Not on file  . Number of children: Not on file  . Years of education: Not on file  . Highest education level: Not on file  Occupational History  . Not on file  Tobacco Use  . Smoking status: Never Smoker  . Smokeless tobacco: Never Used  Substance and Sexual Activity  . Alcohol use: No  . Drug use: No  . Sexual activity: Not Currently  Other Topics Concern  . Not on file  Social History Narrative  . Not on file   Social Determinants of Health   Financial Resource Strain:   . Difficulty of Paying Living Expenses:   Food Insecurity:   . Worried About Programme researcher, broadcasting/film/video in the Last Year:   . Barista in the Last Year:   Transportation Needs:   . Freight forwarder (Medical):   Marland Kitchen Lack of Transportation (Non-Medical):   Physical Activity:   . Days of Exercise per Week:   . Minutes of Exercise per Session:   Stress:   . Feeling of Stress :   Social Connections:   . Frequency of Communication with Friends and Family:   . Frequency of Social Gatherings with Friends and Family:   . Attends Religious Services:   .  Active Member of Clubs or Organizations:   . Attends Banker Meetings:   Marland Kitchen Marital Status:   Intimate Partner Violence:   . Fear of Current or Ex-Partner:   . Emotionally Abused:   Marland Kitchen Physically Abused:   . Sexually Abused:     Outpatient Medications Prior to Visit  Medication Sig Dispense Refill  . Cholecalciferol (VITAMIN D3) 5000 units TABS 5,000 IU OTC vitamin D3 daily. 90 tablet 3  . insulin degludec (TRESIBA FLEXTOUCH) 100 UNIT/ML FlexTouch Pen Inject 0.3 mLs (30 Units total) into the skin at bedtime. 10 pen 0  . losartan (COZAAR) 50 MG tablet Take 1 tablet (50 mg total) by mouth daily. 90 tablet 0  . NOVOLOG FLEXPEN 100 UNIT/ML FlexPen 10-20 units 3 times  daily with meals 15 mL 2   No facility-administered medications prior to visit.    No Known Allergies  ROS Review of Systems Review of Systems:  A fourteen system review of systems was performed and found to be positive as per HPI.    Objective:    Physical Exam General:  Well Developed, well nourished, appropriate for stated age.  Neuro:  Alert and oriented,  extra-ocular muscles intact, no focal deficits HEENT:  Normocephalic, atraumatic, neck supple Skin:  no gross rash, warm, pink. Cardiac:  RRR, S1 S2 Respiratory:  ECTA B/L, Not using accessory muscles, speaking in full sentences- unlabored. Vascular:  Ext warm, no cyanosis apprec.; no gross edema Psych:  No HI/SI, judgement and insight good, Euthymic mood. Full Affect.  BP 114/68   Pulse 63   Temp 98.1 F (36.7 C) (Oral)   Ht 6\' 4"  (1.93 m)   Wt 257 lb 1.6 oz (116.6 kg)   SpO2 98% Comment: on RA  BMI 31.30 kg/m  Wt Readings from Last 3 Encounters:  04/29/20 257 lb 1.6 oz (116.6 kg)  10/16/19 255 lb (115.7 kg)  06/17/19 271 lb (122.9 kg)     Health Maintenance Due  Topic Date Due  . Hepatitis C Screening  Never done  . PNEUMOCOCCAL POLYSACCHARIDE VACCINE AGE 87-64 HIGH RISK  Never done  . OPHTHALMOLOGY EXAM  Never done    There are no preventive care reminders to display for this patient.  Lab Results  Component Value Date   TSH 3.540 06/17/2019   Lab Results  Component Value Date   WBC 6.7 06/17/2019   HGB 16.3 06/17/2019   HCT 48.2 06/17/2019   MCV 86 06/17/2019   PLT 232 06/17/2019   Lab Results  Component Value Date   NA 139 06/17/2019   K 4.1 06/17/2019   CO2 25 06/17/2019   GLUCOSE 109 (H) 06/17/2019   BUN 14 06/17/2019   CREATININE 1.00 06/17/2019   BILITOT 0.5 06/17/2019   ALKPHOS 67 06/17/2019   AST 21 06/17/2019   ALT 20 06/17/2019   PROT 7.5 06/17/2019   ALBUMIN 4.8 06/17/2019   CALCIUM 9.4 06/17/2019   Lab Results  Component Value Date   CHOL 138 04/26/2020   Lab  Results  Component Value Date   HDL 47 04/26/2020   Lab Results  Component Value Date   LDLCALC 72 04/26/2020   Lab Results  Component Value Date   TRIG 106 04/26/2020   Lab Results  Component Value Date   CHOLHDL 2.9 04/26/2020   Lab Results  Component Value Date   HGBA1C 8.5 (A) 04/29/2020      Assessment & Plan:   Problem List Items Addressed This Visit  Cardiovascular and Mediastinum   Hypertension associated with diabetes (HCC)     Other   Hypertriglyceridemia, familial (Chronic)   Vitamin D insufficiency (Chronic)    Other Visit Diagnoses    Type 1 diabetes mellitus with other specified complication (HCC)    -  Primary   Relevant Orders   POCT glycosylated hemoglobin (Hb A1C) (Completed)     Type 1 diabetes mellitus with other specified complication: -A1c today is 8.5, increased from prior (6.5). -Patient reports compliance with medications and following a diabetic diet, and reports elevated FBS for 4 weeks which could have possibly been related to side effects from COVID-19 vaccine, so will not make medication changes at this time. Instructed to let me know if FBS are consistently above 160 and will make medication adjustments.  Patient verbalized understanding. -Continue NovoLog and Tresiba as instructed. -Continue to follow low carbohydrate and glucose diet. -Foot exam performed today, WNL -Follow up in 3 months to recheck A1c and for medication management.  Hypertension associated with diabetes: -BP today is 114/68 HR 63, at goal. -Continue losartan potassium 50 mg.. -Follow DASH diet. -Continue to stay as active as possible. -Stay well hydrated, at least 64 fl oz  Vitamin D insufficiency: -Most recent vitamin D 26.0, discussed better compliance with medication to maintain vitamin D at a stable level. -Continue vitamin D 5000 units daily and adequate sun exposure. -Will continue to monitor.  Hypertriglyceridemia, familial: -Most recent lipid  panel within normal limits, LDL 72 -Continue to follow heart healthy diet. -Will continue to monitor.   No orders of the defined types were placed in this encounter.   Follow-up: Return in about 3 months (around 07/30/2020) for DM, HTN, Hypothyroid.   Note:  This note was prepared with assistance of Dragon voice recognition software. Occasional wrong-word or sound-a-like substitutions may have occurred due to the inherent limitations of voice recognition software.   Mayer Masker, PA-C

## 2020-05-24 ENCOUNTER — Other Ambulatory Visit: Payer: Self-pay | Admitting: Physician Assistant

## 2020-05-24 ENCOUNTER — Telehealth: Payer: Self-pay | Admitting: Physician Assistant

## 2020-05-24 DIAGNOSIS — E10649 Type 1 diabetes mellitus with hypoglycemia without coma: Secondary | ICD-10-CM

## 2020-05-24 DIAGNOSIS — E109 Type 1 diabetes mellitus without complications: Secondary | ICD-10-CM

## 2020-05-24 NOTE — Telephone Encounter (Signed)
Patient is requesting a refill of his Cristian Kennedy and [address], if approved please send to [facility] on [address]

## 2020-05-25 MED ORDER — TRESIBA FLEXTOUCH 100 UNIT/ML ~~LOC~~ SOPN: 30.0000 [IU] | PEN_INJECTOR | Freq: Every day | SUBCUTANEOUS | 0 refills | Status: DC

## 2020-05-25 MED ORDER — NOVOLOG FLEXPEN 100 UNIT/ML ~~LOC~~ SOPN: PEN_INJECTOR | SUBCUTANEOUS | 2 refills | Status: DC

## 2020-05-25 NOTE — Addendum Note (Signed)
Addended by: Sylvester Harder on: 05/25/2020 09:03 AM   Modules accepted: Orders

## 2020-05-25 NOTE — Telephone Encounter (Signed)
Refill sent to requested pharmacy. AS, CMA 

## 2020-06-24 ENCOUNTER — Telehealth: Payer: Self-pay | Admitting: Physician Assistant

## 2020-06-24 NOTE — Telephone Encounter (Signed)
Please call patient to schedule lab only for repeat A1C per Kandis Cocking. AS, CMA

## 2020-06-28 ENCOUNTER — Telehealth: Payer: Self-pay | Admitting: Physician Assistant

## 2020-06-28 NOTE — Telephone Encounter (Signed)
Patient needs A1C before forms can be filled out. AS< CMA

## 2020-06-28 NOTE — Telephone Encounter (Signed)
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 He wanted to check status of this item.

## 2020-06-29 ENCOUNTER — Other Ambulatory Visit (INDEPENDENT_AMBULATORY_CARE_PROVIDER_SITE_OTHER): Payer: BC Managed Care – PPO

## 2020-06-29 ENCOUNTER — Other Ambulatory Visit: Payer: Self-pay

## 2020-06-29 DIAGNOSIS — E669 Obesity, unspecified: Secondary | ICD-10-CM | POA: Diagnosis not present

## 2020-06-29 DIAGNOSIS — E1069 Type 1 diabetes mellitus with other specified complication: Secondary | ICD-10-CM | POA: Diagnosis not present

## 2020-06-29 LAB — POCT GLYCOSYLATED HEMOGLOBIN (HGB A1C): Hemoglobin A1C: 8.2 % — AB (ref 4.0–5.6)

## 2020-07-27 ENCOUNTER — Other Ambulatory Visit: Payer: Self-pay

## 2020-07-27 ENCOUNTER — Encounter: Payer: Self-pay | Admitting: Physician Assistant

## 2020-07-27 ENCOUNTER — Ambulatory Visit (INDEPENDENT_AMBULATORY_CARE_PROVIDER_SITE_OTHER): Payer: BC Managed Care – PPO | Admitting: Physician Assistant

## 2020-07-27 VITALS — Ht 75.0 in | Wt 256.7 lb

## 2020-07-27 DIAGNOSIS — E1069 Type 1 diabetes mellitus with other specified complication: Secondary | ICD-10-CM

## 2020-07-27 DIAGNOSIS — E1159 Type 2 diabetes mellitus with other circulatory complications: Secondary | ICD-10-CM | POA: Diagnosis not present

## 2020-07-27 DIAGNOSIS — I10 Essential (primary) hypertension: Secondary | ICD-10-CM

## 2020-07-27 NOTE — Progress Notes (Signed)
Telehealth office visit note for Cristian Masker, PA-C- at Primary Care at [facility]   I connected with current patient today by telephone and verified that I am speaking with the correct person   . Location of the patient: Home . Location of the provider: Office - This visit type was conducted due to national recommendations for restrictions regarding the COVID-19 Pandemic (e.g. social distancing) in an effort to limit this patient's exposure and mitigate transmission in our community.    - No physical exam could be performed with this format, beyond that communicated to [place] by the patient/ family members as noted.   - Additionally my office staff/ schedulers were to discuss with the patient that there may be a monetary charge related to this service, depending on their medical insurance.  My understanding is that patient understood and consented to proceed.     _________________________________________________________________________________   History of Present Illness: Pt calls in to follow up on diabetes mellitus and hypertension. Pt reports he is doing well and has no acute concerns todau.  Diabetes: Pt denies increased urination or thirst. Pt reports medication compliance. No hypoglycemic events. Checking glucose at home and has noticed his sugars are better. FBS range 89-90s and reports highest reading has been 148. States he has made diet changes which is heavy on vegetables. Continues to stay active.  HTN: Pt denies chest pain, palpitations, dizziness or lower extremity swelling. Taking medication as directed without side effects. 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     No flowsheet data found.  Depression screen [facility] 2/9 07/27/2020 04/29/2020 10/16/2019 09/30/2018 04/18/2018  Decreased Interest 0 0 0 0 0  Down, Depressed, Hopeless 0 0 0 0 0  PHQ - 2 Score 0 0 0 0 0  Altered sleeping 0 0 0 0 0  Tired, decreased energy  0 0 0 0 0  Change in appetite 0 0 0 0 0  Feeling bad or failure about yourself  0 0 0 0 0  Trouble concentrating 0 0 0 0 0  Moving slowly or fidgety/restless 0 0 0 0 0  Suicidal thoughts 0 0 0 0 0  PHQ-9 Score 0 0 0 0 0  Difficult doing work/chores - - - Not difficult at all Not difficult at all      Impression and Recommendations:     1. Type 1 diabetes mellitus with other specified complication (HCC)   2. Hypertension associated with diabetes (HCC)     Type 1 diabetes mellitus with other specified complication: -Last A1c improved from prior and ambulatory glucose readings have improved so will continue current medication regimen. If A1c is not at goal next OV then will consider adjusting medication.  -Continue ambulatory glucose monitoring. -Follow low carbohydrate and glucose diet. -Patient had an eye exam this year but is going to check if it was a comprhensive exam, and if not then will schedule one. -Will continue to monitor.  HTN associated with diabetes: - BP stable - Continue Losartan - Follow a low sodium diet. - Continue to stay active. - Will continue to monitor.   - As part of my medical decision making, I reviewed the following data within the electronic MEDICAL RECORD NUMBER History obtained from pt /family, CMA notes reviewed and incorporated if applicable, Labs reviewed, Radiograph/ tests reviewed if applicable and OV notes from prior OV's with me, as well as any other specialists she/he  has seen since seeing me last, were all reviewed and used in my medical decision making process today.    - Additionally, when appropriate, discussion had with patient regarding our treatment plan, and their biases/concerns about that plan were used in my medical decision making today.    - The patient agreed with the plan and demonstrated an understanding of the instructions.   No barriers to understanding were identified.     - The patient was advised to call back or seek an  in-person evaluation if the symptoms worsen or if the condition fails to improve as anticipated.   Return in about 3 months (around 10/26/2020) for DM, HTN.    No orders of the defined types were placed in this encounter.   No orders of the defined types were placed in this encounter.   There are no discontinued medications.     Time spent on visit including pre-visit chart review and post-visit care was 8 minutes.      The 21st Century Cures Act was signed into law in 2016 which includes the topic of electronic health records.  This provides immediate access to information in MyChart.  This includes consultation notes, operative notes, office notes, lab results and pathology reports.  If you have any questions about what you read please let us know at your next visit or call us at the office.  We are right here with you.  Note:  This note was prepared with assistance of Dragon voice recognition software. Occasional wrong-word or sound-a-like substitutions may have occurred due to the inherent limitations of voice recognition software.   __________________________________________________________________________________     Patient Care Team    Relationship Specialty Notifications Start End  Cristian Kennedy, [place] PCP - General   02/29/20      -Vitals obtained; medications/ allergies reconciled;  personal medical, social, Sx etc.histories were updated by CMA, reviewed by me and are reflected in chart   Patient Active Problem List   Diagnosis Date Noted  . Patient on ketogenic diet 10/16/2019  . Type 1 diabetes mellitus with obesity (HCC) 10/16/2019  . Hypertension associated with diabetes (HCC) 01/24/2019  . Borderline hypertension 02/21/2018  . High risk medications (not anticoagulants) long-term use 02/21/2018  . h/o Hypothyroidism 05/07/2017  . Vitamin D insufficiency 06/25/2016  . Elevated TSH 06/25/2016  . Type 1 diabetes mellitus without complication (HCC)  06/13/2016  . Hypertriglyceridemia, familial 06/13/2016  . Family history of early CAD- father died AMI age 37 07/06/16  . Family history of essential hypertension 07/06/16  . Family history of diabetes mellitus (DM) Jul 06, 2016     Current Meds  Medication Sig  . Cholecalciferol (VITAMIN D3) 5000 units TABS 5,000 IU OTC vitamin D3 daily.  . insulin degludec (TRESIBA FLEXTOUCH) 100 UNIT/ML FlexTouch Pen Inject 0.3 mLs (30 Units total) into the skin at bedtime.  Marland Kitchen losartan (COZAAR) 50 MG tablet Take 1 tablet (50 mg total) by mouth daily.  Marland Kitchen NOVOLOG FLEXPEN 100 UNIT/ML FlexPen 10-20 units 3 times daily with meals     Allergies:  No Known Allergies   ROS:  See above HPI for pertinent positives and negatives   Objective:   Height 6\' 3"  (1.905 m), weight 256 lb 11.2 oz (116.4 kg).  (if some vitals are omitted, this means that patient was UNABLE to obtain them even though they were asked to get them prior to OV today.  They were asked to call at their earliest convenience with these once obtained. )  General: A & O * 3; sounds in no acute distress; in usual state of health.  Respiratory: speaking in full sentences, no conversational dyspnea;  Psych: insight appears good, mood- appears full

## 2020-09-14 ENCOUNTER — Telehealth: Payer: Self-pay | Admitting: Physician Assistant

## 2020-09-14 DIAGNOSIS — E109 Type 1 diabetes mellitus without complications: Secondary | ICD-10-CM

## 2020-09-14 MED ORDER — TRESIBA FLEXTOUCH 100 UNIT/ML ~~LOC~~ SOPN: 30.0000 [IU] | PEN_INJECTOR | Freq: Every day | SUBCUTANEOUS | 0 refills | Status: DC

## 2020-09-14 NOTE — Telephone Encounter (Signed)
Patient needs a refill on Tresiba (His mother called) ,and uses [facility] on [address] Lynford Humphrey

## 2020-09-14 NOTE — Telephone Encounter (Signed)
Refill sent to pharmacy. AS, CMA 

## 2020-09-14 NOTE — Addendum Note (Signed)
Addended by: Sylvester Harder on: 09/14/2020 04:27 PM   Modules accepted: Orders

## 2020-11-22 ENCOUNTER — Other Ambulatory Visit: Payer: Self-pay

## 2020-11-22 DIAGNOSIS — E109 Type 1 diabetes mellitus without complications: Secondary | ICD-10-CM

## 2020-11-23 ENCOUNTER — Encounter: Payer: Self-pay | Admitting: Physician Assistant

## 2020-11-23 ENCOUNTER — Ambulatory Visit (INDEPENDENT_AMBULATORY_CARE_PROVIDER_SITE_OTHER): Payer: BC Managed Care – PPO | Admitting: Physician Assistant

## 2020-11-23 VITALS — Ht 75.0 in | Wt 256.0 lb

## 2020-11-23 DIAGNOSIS — E109 Type 1 diabetes mellitus without complications: Secondary | ICD-10-CM

## 2020-11-23 DIAGNOSIS — I152 Hypertension secondary to endocrine disorders: Secondary | ICD-10-CM | POA: Diagnosis not present

## 2020-11-23 DIAGNOSIS — E1159 Type 2 diabetes mellitus with other circulatory complications: Secondary | ICD-10-CM

## 2020-11-23 LAB — MICROALBUMIN / CREATININE URINE RATIO
Creatinine, Urine: 53.5 mg/dL
Microalb/Creat Ratio: <6 mg/g creat (ref 0–29)
Microalbumin, Urine: <3 ug/mL

## 2020-11-23 LAB — HEMOGLOBIN A1C
Est. average glucose Bld gHb Est-mCnc: 206 mg/dL
Hgb A1c MFr Bld: 8.8 % — ABNORMAL HIGH (ref 4.8–5.6)

## 2020-11-23 MED ORDER — LOSARTAN POTASSIUM 50 MG PO TABS
50.0000 mg | ORAL_TABLET | Freq: Every day | ORAL | 1 refills | Status: DC
Start: 2020-11-23 — End: 2023-03-22

## 2020-11-23 NOTE — Progress Notes (Signed)
Telehealth office visit note for Cristian Masker, PA-C- at Primary Care at [facility]   I connected with current patient today by telephone and verified that I am speaking with the correct person   . Location of the patient: Home . Location of the provider: Office - This visit type was conducted due to national recommendations for restrictions regarding the COVID-19 Pandemic (e.g. social distancing) in an effort to limit this patient's exposure and mitigate transmission in our community.    - No physical exam could be performed with this format, beyond that communicated to [place] by the patient/ family members as noted.   - Additionally my office staff/ schedulers were to discuss with the patient that there may be a monetary charge related to this service, depending on their medical insurance.  My understanding is that patient understood and consented to proceed.     _________________________________________________________________________________   History of Present Illness: Patient called in to follow-up on diabetes mellitus type 1 and hypertension.  Has no acute concerns today.  Diabetes mellitus type I: Pt denies increased urination or thirst. Pt reports medication compliance. No hypoglycemic events. Checking glucose at home.  FBS average 130-160.  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  Also reports over the holidays was not as diligent with monitoring carbohydrates and sweets.  Has also not been working out as much.  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  HTN: Pt denies chest pain, palpitations, dizziness, headache or leg swelling. Taking medication as directed without side effects. Is trying to stay hydrated.      No flowsheet data found.  Depression screen [facility] 2/9 11/23/2020 07/27/2020  04/29/2020 10/16/2019 09/30/2018  Decreased Interest 0 0 0 0 0  Down, Depressed, Hopeless 0 0 0 0 0  PHQ - 2 Score 0 0 0 0 0  Altered sleeping 0 0 0 0 0  Tired, decreased energy 0 0 0 0 0  Change in appetite 0 0 0 0 0  Feeling bad or failure about yourself  0 0 0 0 0  Trouble concentrating 0 0 0 0 0  Moving slowly or fidgety/restless 0 0 0 0 0  Suicidal thoughts 0 0 0 0 0  PHQ-9 Score 0 0 0 0 0  Difficult doing work/chores - - - - Not difficult at all      Impression and Recommendations:     1. Type 1 diabetes mellitus without complication (HCC)   2. Hypertension associated with diabetes (HCC)     Type diabetes mellitus without complication: -Recent A1c 8.8, increased from 8.2.  Likely multifactorial from increased carbohydrates and sweets, stress and decreased moderate-rigorous physical activity.  Advised to increase NovoLog to 15 units with largest meal. Continue Tresiba 30 units at bedtime. -Continue ambulatory glucose monitoring. -Recommend to reduce carbohydrates and glucose. -Recent urine microalbumin/creatinine ratio normal -Will continue to monitor.  Hypertension associated with diabetes: -Patient unable to obtain blood pressure today. -Continue current medication regimen. Provided refill. -Stay well-hydrated and follow low-sodium diet. -Will continue to monitor.  Plan to repeat CMP at next OV.   - As part of my medical decision making, I reviewed the following data within the electronic MEDICAL RECORD NUMBER History obtained from pt /family, CMA notes reviewed and incorporated if applicable, Labs reviewed, Radiograph/ tests reviewed  if applicable and OV notes from prior OV's with me, as well as any other specialists she/he has seen since seeing me last, were all reviewed and used in my medical decision making process today.    - Additionally, when appropriate, discussion had with patient regarding our treatment plan, and their biases/concerns about that plan were used in my  medical decision making today.    - The patient agreed with the plan and demonstrated an understanding of the instructions.   No barriers to understanding were identified.     - The patient was advised to call back or seek an in-person evaluation if the symptoms worsen or if the condition fails to improve as anticipated.   Return in about 3 months (around 02/21/2021) for DM, HTN.    No orders of the defined types were placed in this encounter.   Meds ordered this encounter  Medications  . losartan (COZAAR) 50 MG tablet    Sig: Take 1 tablet (50 mg total) by mouth daily.    Dispense:  90 tablet    Refill:  1    Order Specific Question:   Supervising Provider    Answer:   Nani Gasser D [[id number]]    Medications Discontinued During This Encounter  Medication Reason  . losartan (COZAAR) 50 MG tablet Reorder       Time spent on visit including pre-visit chart review and post-visit care was 15 minutes.      The 21st Century Cures Act was signed into law in 2016 which includes the topic of electronic health records.  This provides immediate access to information in MyChart.  This includes consultation notes, operative notes, office notes, lab results and pathology reports.  If you have any questions about what you read please let us know at your next visit or call us at the office.  We are right here with you.   Note:  This note was prepared with assistance of Dragon voice recognition software. Occasional wrong-word or sound-a-like substitutions may have occurred due to the inherent limitations of voice recognition software.  __________________________________________________________________________________     Patient Care Team    Relationship Specialty Notifications Start End  Cristian Kennedy, [place] PCP - General   02/29/20      -Vitals obtained; medications/ allergies reconciled;  personal medical, social, Sx etc.histories were updated by CMA, reviewed by me and are  reflected in chart   Patient Active Problem List   Diagnosis Date Noted  . Patient on ketogenic diet 10/16/2019  . Type 1 diabetes mellitus with obesity (HCC) 10/16/2019  . Hypertension associated with diabetes (HCC) 01/24/2019  . Borderline hypertension 02/21/2018  . High risk medications (not anticoagulants) long-term use 02/21/2018  . h/o Hypothyroidism 05/07/2017  . Vitamin D insufficiency 06/25/2016  . Elevated TSH 06/25/2016  . Type 1 diabetes mellitus without complication (HCC) 06/13/2016  . Hypertriglyceridemia, familial 06/13/2016  . Family history of early CAD- father died AMI age 7 06-29-2016  . Family history of essential hypertension 06-29-2016  . Family history of diabetes mellitus (DM) 06-29-16     Current Meds  Medication Sig  . Cholecalciferol (VITAMIN D3) 5000 units TABS 5,000 IU OTC vitamin D3 daily.  . insulin degludec (TRESIBA FLEXTOUCH) 100 UNIT/ML FlexTouch Pen Inject 30 Units into the skin at bedtime.  Marland Kitchen NOVOLOG FLEXPEN 100 UNIT/ML FlexPen 10-20 units 3 times daily with meals  . [DISCONTINUED] losartan (COZAAR) 50 MG tablet Take 1 tablet (50 mg total) by mouth daily.  Allergies:  No Known Allergies   ROS:  See above HPI for pertinent positives and negatives   Objective:   Height 6\' 3"  (1.905 m), weight 256 lb (116.1 kg).  (if some vitals are omitted, this means that patient was UNABLE to obtain them.) General: A & O * 3; sounds in no acute distress Respiratory: speaking in full sentences, no conversational dyspnea Psych: insight appears good, mood- appears full

## 2020-12-20 ENCOUNTER — Telehealth: Payer: Self-pay | Admitting: Physician Assistant

## 2020-12-20 DIAGNOSIS — E10649 Type 1 diabetes mellitus with hypoglycemia without coma: Secondary | ICD-10-CM

## 2020-12-20 DIAGNOSIS — E109 Type 1 diabetes mellitus without complications: Secondary | ICD-10-CM

## 2020-12-20 MED ORDER — TRESIBA FLEXTOUCH 100 UNIT/ML ~~LOC~~ SOPN: 30.0000 [IU] | PEN_INJECTOR | Freq: Every day | SUBCUTANEOUS | 0 refills | Status: DC

## 2020-12-20 MED ORDER — NOVOLOG FLEXPEN 100 UNIT/ML ~~LOC~~ SOPN: PEN_INJECTOR | SUBCUTANEOUS | 2 refills | Status: DC

## 2020-12-20 NOTE — Telephone Encounter (Signed)
Refills sent to pharmacy. AS, CMA 

## 2021-02-28 ENCOUNTER — Telehealth: Payer: Self-pay | Admitting: Physician Assistant

## 2021-02-28 DIAGNOSIS — E109 Type 1 diabetes mellitus without complications: Secondary | ICD-10-CM

## 2021-02-28 MED ORDER — TRESIBA FLEXTOUCH 100 UNIT/ML ~~LOC~~ SOPN: 30.0000 [IU] | PEN_INJECTOR | Freq: Every day | SUBCUTANEOUS | 0 refills | Status: DC

## 2021-02-28 NOTE — Telephone Encounter (Signed)
error 

## 2021-02-28 NOTE — Addendum Note (Signed)
Addended by: Sylvester Harder on: 02/28/2021 04:42 PM   Modules accepted: Orders

## 2021-02-28 NOTE — Telephone Encounter (Signed)
Patient needs a refill on his tresiba flextouch and uses [facility] on [address] Lynford Humphrey

## 2021-03-17 ENCOUNTER — Ambulatory Visit (INDEPENDENT_AMBULATORY_CARE_PROVIDER_SITE_OTHER): Payer: BC Managed Care – PPO | Admitting: Physician Assistant

## 2021-03-17 ENCOUNTER — Other Ambulatory Visit: Payer: Self-pay

## 2021-03-17 ENCOUNTER — Encounter: Payer: Self-pay | Admitting: Physician Assistant

## 2021-03-17 VITALS — BP 126/77 | HR 80 | Temp 98.0°F | Ht 75.0 in | Wt 273.9 lb

## 2021-03-17 DIAGNOSIS — E559 Vitamin D deficiency, unspecified: Secondary | ICD-10-CM

## 2021-03-17 DIAGNOSIS — E1159 Type 2 diabetes mellitus with other circulatory complications: Secondary | ICD-10-CM

## 2021-03-17 DIAGNOSIS — E039 Hypothyroidism, unspecified: Secondary | ICD-10-CM

## 2021-03-17 DIAGNOSIS — Z0000 Encounter for general adult medical examination without abnormal findings: Secondary | ICD-10-CM

## 2021-03-17 DIAGNOSIS — I152 Hypertension secondary to endocrine disorders: Secondary | ICD-10-CM

## 2021-03-17 DIAGNOSIS — E1069 Type 1 diabetes mellitus with other specified complication: Secondary | ICD-10-CM

## 2021-03-17 DIAGNOSIS — E781 Pure hyperglyceridemia: Secondary | ICD-10-CM | POA: Diagnosis not present

## 2021-03-17 DIAGNOSIS — E109 Type 1 diabetes mellitus without complications: Secondary | ICD-10-CM

## 2021-03-17 LAB — POCT GLYCOSYLATED HEMOGLOBIN (HGB A1C): Hemoglobin A1C: 8.9 % — AB (ref 4.0–5.6)

## 2021-03-17 MED ORDER — TRESIBA FLEXTOUCH 100 UNIT/ML ~~LOC~~ SOPN: 35.0000 [IU] | PEN_INJECTOR | Freq: Every day | SUBCUTANEOUS | 1 refills | Status: DC

## 2021-03-17 NOTE — Patient Instructions (Signed)
Blood Glucose Monitoring, Adult Monitoring your blood sugar (glucose) is an important part of managing your diabetes. Blood glucose monitoring involves checking your blood glucose as often as directed and keeping a log or record of your results over time. Checking your blood glucose regularly and keeping a blood glucose log can:  Help you and your health care provider adjust your diabetes management plan as needed, including your medicines or insulin.  Help you understand how food, exercise, illnesses, and medicines affect your blood glucose.  Let you know what your blood glucose is at any time. You can quickly find out if you have low blood glucose (hypoglycemia) or high blood glucose (hyperglycemia). Your health care provider will set individualized treatment goals for you. Your goals will be based on your age, other medical conditions you have, and how you respond to diabetes treatment. Generally, the goal of treatment is to maintain the following blood glucose levels:  Before meals (preprandial): 80-130 mg/dL (4.4-7.2 mmol/L).  After meals (postprandial): below 180 mg/dL (10 mmol/L).  A1C level: less than 7%. Supplies needed:  Blood glucose meter.  Test strips for your meter. Each meter has its own strips. You must use the strips that came with your meter.  A needle to prick your finger (lancet). Do not use a lancet more than one time.  A device that holds the lancet (lancing device).  A journal or log book to write down your results. How to check your blood glucose Checking your blood glucose 1. Wash your hands for at least 20 seconds with soap and water. 2. Prick the side of your finger (not the tip) with the lancet. Do not use the same finger consecutively. 3. Gently rub the finger until a small drop of blood appears. 4. Follow instructions that come with your meter for inserting the test strip, applying blood to the strip, and using your blood glucose meter. 5. Write down  your result and any notes in your log.   Using alternative sites Some meters allow you to use areas of your body other than your finger (alternative sites) to test your blood. The most common alternative sites are the forearm, the thigh, and the palm of your hand. Alternative sites may not be as accurate as the fingers because blood flow is slower in those areas. This means that the result you get may be delayed, and it may be different from the result that you would get from your finger. Use the finger only, and do not use alternative sites, if:  You think you have hypoglycemia.  You sometimes do not know that your blood glucose is getting low (hypoglycemia unawareness). General tips and recommendations Blood glucose log  Every time you check your blood glucose, write down your result. Also write down any notes about things that may be affecting your blood glucose, such as your diet and exercise for the day. This information can help you and your health care provider: ? Look for patterns in your blood glucose over time. ? Adjust your diabetes management plan as needed.  Check if your meter allows you to download your records to a computer or if there is an app for the meter. Most glucose meters store a record of glucose readings in the meter.   If you have type 1 diabetes:  Check your blood glucose 4 or more times a day if you are on intensive insulin therapy with multiple daily injections (MDI) or if you are using an insulin pump. Check your   blood glucose: ? Before every meal and snack. ? Before bedtime.  Also check your blood glucose: ? If you have symptoms of hypoglycemia. ? After treating low blood glucose. ? Before doing activities that create a risk for injury, like driving or using machinery. ? Before and after exercise. ? Two hours after a meal. ? Occasionally between 2:00 a.m. and 3:00 a.m., as directed.  You may need to check your blood glucose more often, 6-10 times per  day, if: ? You have diabetes that is not well controlled. ? You are ill. ? You have a history of severe hypoglycemia. ? You have hypoglycemia unawareness. If you have type 2 diabetes:  Check your blood glucose 2 or more times a day if you take insulin or other diabetes medicines.  Check your blood glucose 4 or more times a day if you are on intensive insulin therapy. Occasionally, you may also need to check your glucose between 2:00 a.m. and 3:00 a.m., as directed.  Also check your blood glucose: ? Before and after exercise. ? Before doing activities that create a risk for injury, like driving or using machinery.  You may need to check your blood glucose more often if: ? Your medicine is being adjusted. ? Your diabetes is not well controlled. ? You are ill. General tips  Make sure you always have your supplies with you.  After you use a few boxes of test strips, adjust (calibrate) your blood glucose meter by following instructions that came with your meter.  If you have questions or need help, all blood glucose meters have a 24-hour hotline phone number available that you can call. Also contact your health care provider with questions or concerns you may have. Where to find more information  The American Diabetes Association: www.diabetes.org  The Association of Diabetes Care & Education Specialists: www.diabeteseducator.org Contact a health care provider if:  Your blood glucose is at or above 240 mg/dL (13.3 mmol/L) for 2 days in a row.  You have been sick or have had a fever for 2 days or longer, and you are not getting better.  You have any of the following problems for more than 6 hours: ? You cannot eat or drink. ? You have nausea or vomiting. ? You have diarrhea. Get help right away if:  Your blood glucose is lower than 54 mg/dL (3 mmol/L).  You become confused, or you have trouble thinking clearly.  You have difficulty breathing.  You have moderate or large  ketone levels in your urine. These symptoms may represent a serious problem that is an emergency. Do not wait to see if the symptoms will go away. Get medical help right away. Call your local emergency services (911 in the [place] Do not drive yourself to the hospital. Summary  Monitoring your blood glucose is an important part of managing your diabetes.  Blood glucose monitoring involves checking your blood glucose as often as directed and keeping a log or record of your results over time.  Your health care provider will set individualized treatment goals for you. Your goals will be based on your age, other medical conditions you have, and how you respond to diabetes treatment.  Every time you check your blood glucose, write down your result. Also, write down any notes about things that may be affecting your blood glucose, such as your diet and exercise for the day. This information is not intended to replace advice given to you by your health care provider. Make sure   you discuss any questions you have with your health care provider. Document Revised: 07/14/2020 Document Reviewed: 07/14/2020 Elsevier Patient Education  2021 Elsevier Inc.  

## 2021-03-17 NOTE — Progress Notes (Signed)
Established Patient Office Visit  Subjective:  Patient ID: Cristian Kennedy, male    DOB: 11/04/1995  Age: 25 y.o. MRN: [id number]  CC:  Chief Complaint  Patient presents with  . Follow-up  . Diabetes  . Hypertension    HPI Cristian Kennedy presents for follow up on diabetes and hypertension.  Diabetes: Pt denies increased urination or thirst. Pt reports medication compliance. One hypoglycemic event where glucose was 76. Patient states he usually does not exhibit the normal hypoglycemia symptoms, just feels tired. Checking glucose at home fasting and PPS. Reports FBS continue to be elevated, highest 232. 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 Diet has not been the best. Is eating dinner late at night. Continues to have work stress due to short staffing leading to increased responsibilities.   HTN: Pt denies chest pain, palpitations, dizziness or edema. Taking medication as directed without side effects. Checks BP at home few times/wk and readings range 130-140/70-80. Reports good hydration.   Past Medical History:  Diagnosis Date  . Family history of diabetes mellitus (DM) 03-Jul-2016  . Family history of early CAD- father died AMI age 88 07/03/16  . Family history of essential hypertension July 03, 2016  . Overweight (BMI 25.0-29.9) 07/03/16    Past Surgical History:  Procedure Laterality Date  . TONSILLECTOMY AND ADENOIDECTOMY    . TYMPANOSTOMY TUBE PLACEMENT      Family History  Problem Relation Age of Onset  . Cancer Mother        leukemia  . Hypertension Mother   . Alcohol abuse Father   . Stroke Father   . Alcohol abuse Paternal Uncle   . Diabetes Maternal Grandmother   . Hypertension Maternal Grandmother   . Cancer Paternal Grandmother        stomach/liver  . Diabetes Paternal Grandmother   . Hypertension Paternal Grandmother   . Alcohol abuse Paternal Grandfather     Social History   Socioeconomic History  .  Marital status: Single    Spouse name: Not on file  . Number of children: Not on file  . Years of education: Not on file  . Highest education level: Not on file  Occupational History  . Not on file  Tobacco Use  . Smoking status: Never Smoker  . Smokeless tobacco: Never Used  Substance and Sexual Activity  . Alcohol use: No  . Drug use: No  . Sexual activity: Not Currently  Other Topics Concern  . Not on file  Social History Narrative  . Not on file   Social Determinants of Health   Financial Resource Strain: Not on file  Food Insecurity: Not on file  Transportation Needs: Not on file  Physical Activity: Not on file  Stress: Not on file  Social Connections: Not on file  Intimate Partner Violence: Not on file    Outpatient Medications Prior to Visit  Medication Sig Dispense Refill  . Cholecalciferol (VITAMIN D3) 5000 units TABS 5,000 IU OTC vitamin D3 daily. 90 tablet 3  . losartan (COZAAR) 50 MG tablet Take 1 tablet (50 mg total) by mouth daily. 90 tablet 1  . NOVOLOG FLEXPEN 100 UNIT/ML FlexPen 10-20 units 3 times daily with meals 15 mL 2  . insulin degludec (TRESIBA FLEXTOUCH) 100 UNIT/ML FlexTouch Pen Inject 30 Units into the skin at bedtime. 3 mL 0   No facility-administered medications prior to visit.    No Known Allergies  ROS Review  of Systems A fourteen system review of systems was performed and found to be positive as per HPI.   Objective:    Physical Exam General:  Pleasant and cooperative, in no acute distress  Neuro:  Alert and oriented,  extra-ocular muscles intact  HEENT:  Normocephalic, atraumatic, neck supple Skin:  no gross rash, warm, pink. Cardiac:  RRR, S1 S2 wnl's Respiratory:  ECTA B/L w/o wheezing, Not using accessory muscles, speaking in full sentences- unlabored. Vascular:  Ext warm, no cyanosis apprec.; cap RF less 2 sec. Psych:  No HI/SI, judgement and insight good, Euthymic mood. Full Affect.   BP 126/77   Pulse 80   Temp 98 F  (36.7 C)   Ht _0  (1.905 m)   Wt 273 lb 14.4 oz (124.2 kg)   SpO2 100%   BMI 34.24 kg/m  Wt Readings from Last 3 Encounters:  03/17/21 273 lb 14.4 oz (124.2 kg)  11/23/20 256 lb (116.1 kg)  07/27/20 256 lb 11.2 oz (116.4 kg)     Health Maintenance Due  Topic Date Due  . PNEUMOCOCCAL POLYSACCHARIDE VACCINE AGE 80-64 HIGH RISK  Never done  . OPHTHALMOLOGY EXAM  Never done  . HPV VACCINES (2 - Male 3-dose series) 12/10/2012  . Hepatitis C Screening  Never done  . COVID-19 Vaccine (3 - Booster for Moderna series) 06/25/2020       Topic Date Due  . HPV VACCINES (2 - Male 3-dose series) 12/10/2012    Lab Results  Component Value Date   TSH 3.540 03/17/2021   Lab Results  Component Value Date   WBC 4.6 03/17/2021   HGB 16.3 03/17/2021   HCT 47.3 03/17/2021   MCV 87 03/17/2021   PLT 213 03/17/2021   Lab Results  Component Value Date   NA 139 03/17/2021   K 5.0 03/17/2021   CO2 23 03/17/2021   GLUCOSE 126 (H) 03/17/2021   BUN 16 03/17/2021   CREATININE 1.10 03/17/2021   BILITOT 0.6 03/17/2021   ALKPHOS 74 03/17/2021   AST 24 03/17/2021   ALT 27 03/17/2021   PROT 7.0 03/17/2021   ALBUMIN 4.7 03/17/2021   CALCIUM 9.2 03/17/2021   EGFR 95 03/17/2021   Lab Results  Component Value Date   CHOL 136 03/17/2021   Lab Results  Component Value Date   HDL 53 03/17/2021   Lab Results  Component Value Date   LDLCALC 68 03/17/2021   Lab Results  Component Value Date   TRIG 73 03/17/2021   Lab Results  Component Value Date   CHOLHDL 2.6 03/17/2021   Lab Results  Component Value Date   HGBA1C 8.9 (A) 03/17/2021      Assessment & Plan:   Problem List Items Addressed This Visit      Cardiovascular and Mediastinum   Hypertension associated with diabetes ([place])   Relevant Medications   insulin degludec (TRESIBA FLEXTOUCH) 100 UNIT/ML FlexTouch Pen   Other Relevant Orders   Comp Met (CMET) (Completed)   CBC w/Diff (Completed)     Endocrine   h/o  Hypothyroidism (Chronic)   Relevant Orders   TSH (Completed)     Other   Hypertriglyceridemia, familial (Chronic)   Relevant Orders   Lipid Profile (Completed)   CBC w/Diff (Completed)   Vitamin D insufficiency (Chronic)   Relevant Orders   Vitamin D (25 hydroxy) (Completed)    Other Visit Diagnoses    Type 1 diabetes mellitus with other specified complication ([place])    -  Primary   Relevant Medications   insulin degludec (TRESIBA FLEXTOUCH) 100 UNIT/ML FlexTouch Pen   Other Relevant Orders   POCT glycosylated hemoglobin (Hb A1C) (Completed)   Comp Met (CMET) (Completed)   CBC w/Diff (Completed)   Healthcare maintenance         Type 1 diabetes mellitus with other specified complication: -H8E today 8.9, continues to be above goal and has increased from 8.8.  Work stress and poor diet likely contributing to uncontrolled diabetes. Discussed with patient treatment adjustments and is agreeable to increase Tresiba to 35 units at bedtime.  Continue NovoLog. -Recommend to reduce carbohydrates, continue to monitor sweets and continue to stay as active as possible. -Continue ambulatory glucose monitoring. -Follow-up in 3 months.  Hypertension associated with diabetes: -BP initially elevated, BP recheck improved and at goal.  Discussed with patient BP goal is <130/80, if ambulatory BP monitoring consistently above goal then recommend medication adjustment.  Patient verbalized understanding. Continue losartan 50 mg. -Follow low-sodium diet and stay well-hydrated. -Will collect CMP for medication monitoring.  Healthcare maintenance: -Patient is fasting so will collect fasting blood work. -Recommend to schedule CPE in the near future. -Follow a heart healthy diet.  Meds ordered this encounter  Medications  . insulin degludec (TRESIBA FLEXTOUCH) 100 UNIT/ML FlexTouch Pen    Sig: Inject 35 Units into the skin at bedtime.    Dispense:  30 mL    Refill:  1    Order Specific Question:    Supervising Provider    Answer:   Beatrice Lecher D [[id number]]    Follow-up: Return in about 3 months (around 06/17/2021) for DM, HTN, Thyroid.  Note:  This note was prepared with assistance of Dragon voice recognition software. Occasional wrong-word or sound-a-like substitutions may have occurred due to the inherent limitations of voice recognition software.  Lorrene Reid, PA-C

## 2021-03-18 LAB — CBC WITH DIFFERENTIAL/PLATELET
Basophils Absolute: 0 10*3/uL (ref 0.0–0.2)
Basos: 1 %
EOS (ABSOLUTE): 0.1 10*3/uL (ref 0.0–0.4)
Eos: 2 %
Hematocrit: 47.3 % (ref 37.5–51.0)
Hemoglobin: 16.3 g/dL (ref 13.0–17.7)
Immature Grans (Abs): 0 10*3/uL (ref 0.0–0.1)
Immature Granulocytes: 0 %
Lymphocytes Absolute: 1.6 10*3/uL (ref 0.7–3.1)
Lymphs: 36 %
MCH: 29.9 pg (ref 26.6–33.0)
MCHC: 34.5 g/dL (ref 31.5–35.7)
MCV: 87 fL (ref 79–97)
Monocytes Absolute: 0.4 10*3/uL (ref 0.1–0.9)
Monocytes: 9 %
Neutrophils Absolute: 2.4 10*3/uL (ref 1.4–7.0)
Neutrophils: 52 %
Platelets: 213 10*3/uL (ref 150–450)
RBC: 5.46 x10E6/uL (ref 4.14–5.80)
RDW: 12.4 % (ref 11.6–15.4)
WBC: 4.6 10*3/uL (ref 3.4–10.8)

## 2021-03-18 LAB — TSH: TSH: 3.54 u[IU]/mL (ref 0.450–4.500)

## 2021-03-18 LAB — COMPREHENSIVE METABOLIC PANEL
ALT: 27 IU/L (ref 0–44)
AST: 24 IU/L (ref 0–40)
Albumin/Globulin Ratio: 2 (ref 1.2–2.2)
Albumin: 4.7 g/dL (ref 4.1–5.2)
Alkaline Phosphatase: 74 IU/L (ref 44–121)
BUN/Creatinine Ratio: 15 (ref 9–20)
BUN: 16 mg/dL (ref 6–20)
Bilirubin Total: 0.6 mg/dL (ref 0.0–1.2)
CO2: 23 mmol/L (ref 20–29)
Calcium: 9.2 mg/dL (ref 8.7–10.2)
Chloride: 100 mmol/L (ref 96–106)
Creatinine, Ser: 1.1 mg/dL (ref 0.76–1.27)
Globulin, Total: 2.3 g/dL (ref 1.5–4.5)
Glucose: 126 mg/dL — ABNORMAL HIGH (ref 65–99)
Potassium: 5 mmol/L (ref 3.5–5.2)
Sodium: 139 mmol/L (ref 134–144)
Total Protein: 7 g/dL (ref 6.0–8.5)
eGFR: 95 mL/min/{1.73_m2} (ref >59)

## 2021-03-18 LAB — LIPID PANEL
Chol/HDL Ratio: 2.6 ratio (ref 0.0–5.0)
Cholesterol, Total: 136 mg/dL (ref 100–199)
HDL: 53 mg/dL (ref >39)
LDL Chol Calc (NIH): 68 mg/dL (ref 0–99)
Triglycerides: 73 mg/dL (ref 0–149)
VLDL Cholesterol Cal: 15 mg/dL (ref 5–40)

## 2021-03-18 LAB — VITAMIN D 25 HYDROXY (VIT D DEFICIENCY, FRACTURES): Vit D, 25-Hydroxy: 24.9 ng/mL — ABNORMAL LOW (ref 30.0–100.0)

## 2021-06-13 LAB — HM DIABETES EYE EXAM

## 2021-06-14 ENCOUNTER — Ambulatory Visit (INDEPENDENT_AMBULATORY_CARE_PROVIDER_SITE_OTHER): Payer: BC Managed Care – PPO | Admitting: Physician Assistant

## 2021-06-14 ENCOUNTER — Ambulatory Visit: Payer: BC Managed Care – PPO | Admitting: Physician Assistant

## 2021-06-14 ENCOUNTER — Other Ambulatory Visit: Payer: Self-pay

## 2021-06-14 ENCOUNTER — Encounter: Payer: Self-pay | Admitting: Physician Assistant

## 2021-06-14 VITALS — BP 129/78 | HR 71 | Temp 97.8°F | Ht 75.0 in | Wt 276.1 lb

## 2021-06-14 DIAGNOSIS — E559 Vitamin D deficiency, unspecified: Secondary | ICD-10-CM | POA: Diagnosis not present

## 2021-06-14 DIAGNOSIS — I152 Hypertension secondary to endocrine disorders: Secondary | ICD-10-CM

## 2021-06-14 DIAGNOSIS — E1069 Type 1 diabetes mellitus with other specified complication: Secondary | ICD-10-CM

## 2021-06-14 DIAGNOSIS — E1159 Type 2 diabetes mellitus with other circulatory complications: Secondary | ICD-10-CM

## 2021-06-14 LAB — POCT GLYCOSYLATED HEMOGLOBIN (HGB A1C): Hemoglobin A1C: 8.9 % — AB (ref 4.0–5.6)

## 2021-06-14 NOTE — Assessment & Plan Note (Signed)
-  A1c today 8.9, unchanged. Recommend to improve adherence with reducing carbohydrate and glucose intake. Continue ambulatory glucose monitoring and monitor for hypoglycemia. Discussed with patient adjusting Tresiba to 38 units if FBS continue to be consistently >140, patient verbalized understanding and will notify the clinic. Continue current medication regimen. Continue physical activity. -Will continue to monitor.

## 2021-06-14 NOTE — Patient Instructions (Signed)
https://www.nhlbi.nih.gov/files/docs/public/heart/dash_brief.pdf">  DASH Eating Plan DASH stands for Dietary Approaches to Stop Hypertension. The DASH eating plan is a healthy eating plan that has been shown to: Reduce high blood pressure (hypertension). Reduce your risk for type 2 diabetes, heart disease, and stroke. Help with weight loss. What are tips for following this plan? Reading food labels Check food labels for the amount of salt (sodium) per serving. Choose foods with less than 5 percent of the Daily Value of sodium. Generally, foods with less than 300 milligrams (mg) of sodium per serving fit into this eating plan. To find whole grains, look for the word "whole" as the first word in the ingredient list. Shopping Buy products labeled as "low-sodium" or "no salt added." Buy fresh foods. Avoid canned foods and pre-made or frozen meals. Cooking Avoid adding salt when cooking. Use salt-free seasonings or herbs instead of table salt or sea salt. Check with your health care provider or pharmacist before using salt substitutes. Do not fry foods. Cook foods using healthy methods such as baking, boiling, grilling, roasting, and broiling instead. Cook with heart-healthy oils, such as olive, canola, avocado, soybean, or sunflower oil. Meal planning  Eat a balanced diet that includes: 4 or more servings of fruits and 4 or more servings of vegetables each day. Try to fill one-half of your plate with fruits and vegetables. 6-8 servings of whole grains each day. Less than 6 oz (170 g) of lean meat, poultry, or fish each day. A 3-oz (85-g) serving of meat is about the same size as a deck of cards. One egg equals 1 oz (28 g). 2-3 servings of low-fat dairy each day. One serving is 1 cup (237 mL). 1 serving of nuts, seeds, or beans 5 times each week. 2-3 servings of heart-healthy fats. Healthy fats called omega-3 fatty acids are found in foods such as walnuts, flaxseeds, fortified milks, and eggs.  These fats are also found in cold-water fish, such as sardines, salmon, and mackerel. Limit how much you eat of: Canned or prepackaged foods. Food that is high in trans fat, such as some fried foods. Food that is high in saturated fat, such as fatty meat. Desserts and other sweets, sugary drinks, and other foods with added sugar. Full-fat dairy products. Do not salt foods before eating. Do not eat more than 4 egg yolks a week. Try to eat at least 2 vegetarian meals a week. Eat more home-cooked food and less restaurant, buffet, and fast food.  Lifestyle When eating at a restaurant, ask that your food be prepared with less salt or no salt, if possible. If you drink alcohol: Limit how much you use to: 0-1 drink a day for women who are not pregnant. 0-2 drinks a day for men. Be aware of how much alcohol is in your drink. In the [place] one drink equals one 12 oz bottle of beer (355 mL), one 5 oz glass of wine (148 mL), or one 1 oz glass of hard liquor (44 mL). General information Avoid eating more than 2,300 mg of salt a day. If you have hypertension, you may need to reduce your sodium intake to 1,500 mg a day. Work with your health care provider to maintain a healthy body weight or to lose weight. Ask what an ideal weight is for you. Get at least 30 minutes of exercise that causes your heart to beat faster (aerobic exercise) most days of the week. Activities may include walking, swimming, or biking. Work with your health care provider   or dietitian to adjust your eating plan to your individual calorie needs. What foods should I eat? Fruits All fresh, dried, or frozen fruit. Canned fruit in natural juice (without addedsugar). Vegetables Fresh or frozen vegetables (raw, steamed, roasted, or grilled). Low-sodium or reduced-sodium tomato and vegetable juice. Low-sodium or reduced-sodium tomatosauce and tomato paste. Low-sodium or reduced-sodium canned vegetables. Grains Whole-grain or  whole-wheat bread. Whole-grain or whole-wheat pasta. Brown rice. Oatmeal. Quinoa. Bulgur. Whole-grain and low-sodium cereals. Pita bread.Low-fat, low-sodium crackers. Whole-wheat flour tortillas. Meats and other proteins Skinless chicken or turkey. Ground chicken or turkey. Pork with fat trimmed off. Fish and seafood. Egg whites. Dried beans, peas, or lentils. Unsalted nuts, nut butters, and seeds. Unsalted canned beans. Lean cuts of beef with fat trimmed off. Low-sodium, lean precooked or cured meat, such as sausages or meatloaves. Dairy Low-fat (1%) or fat-free (skim) milk. Reduced-fat, low-fat, or fat-free cheeses. Nonfat, low-sodium ricotta or cottage cheese. Low-fat or nonfatyogurt. Low-fat, low-sodium cheese. Fats and oils Soft margarine without trans fats. Vegetable oil. Reduced-fat, low-fat, or light mayonnaise and salad dressings (reduced-sodium). Canola, safflower, olive, avocado, soybean, andsunflower oils. Avocado. Seasonings and condiments Herbs. Spices. Seasoning mixes without salt. Other foods Unsalted popcorn and pretzels. Fat-free sweets. The items listed above may not be a complete list of foods and beverages you can eat. Contact a dietitian for more information. What foods should I avoid? Fruits Canned fruit in a light or heavy syrup. Fried fruit. Fruit in cream or buttersauce. Vegetables Creamed or fried vegetables. Vegetables in a cheese sauce. Regular canned vegetables (not low-sodium or reduced-sodium). Regular canned tomato sauce and paste (not low-sodium or reduced-sodium). Regular tomato and vegetable juice(not low-sodium or reduced-sodium). Pickles. Olives. Grains Baked goods made with fat, such as croissants, muffins, or some breads. Drypasta or rice meal packs. Meats and other proteins Fatty cuts of meat. Ribs. Fried meat. Bacon. Bologna, salami, and other precooked or cured meats, such as sausages or meat loaves. Fat from the back of a pig (fatback). Bratwurst.  Salted nuts and seeds. Canned beans with added salt. Canned orsmoked fish. Whole eggs or egg yolks. Chicken or turkey with skin. Dairy Whole or 2% milk, cream, and half-and-half. Whole or full-fat cream cheese. Whole-fat or sweetened yogurt. Full-fat cheese. Nondairy creamers. Whippedtoppings. Processed cheese and cheese spreads. Fats and oils Butter. Stick margarine. Lard. Shortening. Ghee. Bacon fat. Tropical oils, suchas coconut, palm kernel, or palm oil. Seasonings and condiments Onion salt, garlic salt, seasoned salt, table salt, and sea salt. Worcestershire sauce. Tartar sauce. Barbecue sauce. Teriyaki sauce. Soy sauce, including reduced-sodium. Steak sauce. Canned and packaged gravies. Fish sauce. Oyster sauce. Cocktail sauce. Store-bought horseradish. Ketchup. Mustard. Meat flavorings and tenderizers. Bouillon cubes. Hot sauces. Pre-made or packaged marinades. Pre-made or packaged taco seasonings. Relishes. Regular saladdressings. Other foods Salted popcorn and pretzels. The items listed above may not be a complete list of foods and beverages you should avoid. Contact a dietitian for more information. Where to find more information National Heart, Lung, and Blood Institute: www.nhlbi.nih.gov American Heart Association: www.heart.org Academy of Nutrition and Dietetics: www.eatright.org National Kidney Foundation: www.kidney.org Summary The DASH eating plan is a healthy eating plan that has been shown to reduce high blood pressure (hypertension). It may also reduce your risk for type 2 diabetes, heart disease, and stroke. When on the DASH eating plan, aim to eat more fresh fruits and vegetables, whole grains, lean proteins, low-fat dairy, and heart-healthy fats. With the DASH eating plan, you should limit salt (sodium) intake to 2,300   mg a day. If you have hypertension, you may need to reduce your sodium intake to 1,500 mg a day. Work with your health care provider or dietitian to adjust  your eating plan to your individual calorie needs. This information is not intended to replace advice given to you by your health care provider. Make sure you discuss any questions you have with your healthcare provider. Document Revised: 09/19/2019 Document Reviewed: 09/19/2019 Elsevier Patient Education  2022 Elsevier Inc.  

## 2021-06-14 NOTE — Progress Notes (Signed)
Established Patient Office Visit  Subjective:  Patient ID: Cristian Kennedy, male    DOB: August 30, 1996  Age: 25 y.o. MRN: [id number]  CC:  Chief Complaint  Patient presents with   Diabetes    HPI Alexandro Line Gernert presents for follow up on diabetes mellitus and hypertension.  Diabetes mellitus type 1: Pt denies increased urination or thirst. Pt reports medication compliance. No hypoglycemic events. Checking glucose at home. FBS range 130-150. States during the summer is not as diligent with monitoring his diet and calorie intake. States sugar was significantly higher after several cook outs and gatherings. 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 Due for annual eye exam in the fall.  HTN: Pt denies chest pain, palpitations, dizziness or lower extremity swelling. Taking medication as directed without side effects. Has not checked BP recently. Pt reports good hydration.  Vitamin D: Reports taking Vitamin D3 supplement.  Past Medical History:  Diagnosis Date   Family history of diabetes mellitus (DM) 2016-06-24   Family history of early CAD- father died AMI age 72 June 24, 2016   Family history of essential hypertension 06/24/16   Overweight (BMI 25.0-29.9) 2016/06/24    Past Surgical History:  Procedure Laterality Date   TONSILLECTOMY AND ADENOIDECTOMY     TYMPANOSTOMY TUBE PLACEMENT      Family History  Problem Relation Age of Onset   Cancer Mother        leukemia   Hypertension Mother    Alcohol abuse Father    Stroke Father    Alcohol abuse Paternal Uncle    Diabetes Maternal Grandmother    Hypertension Maternal Grandmother    Cancer Paternal Grandmother        stomach/liver   Diabetes Paternal Grandmother    Hypertension Paternal Grandmother    Alcohol abuse Paternal Grandfather     Social History   Socioeconomic History   Marital status: Single    Spouse name: Not on file   Number of children: Not on file   Years of education: Not on file    Highest education level: Not on file  Occupational History   Not on file  Tobacco Use   Smoking status: Never   Smokeless tobacco: Never  Substance and Sexual Activity   Alcohol use: No   Drug use: No   Sexual activity: Not Currently  Other Topics Concern   Not on file  Social History Narrative   Not on file   Social Determinants of Health   Financial Resource Strain: Not on file  Food Insecurity: Not on file  Transportation Needs: Not on file  Physical Activity: Not on file  Stress: Not on file  Social Connections: Not on file  Intimate Partner Violence: Not on file    Outpatient Medications Prior to Visit  Medication Sig Dispense Refill   Cholecalciferol (VITAMIN D3) 5000 units TABS 5,000 IU OTC vitamin D3 daily. 90 tablet 3   insulin degludec (TRESIBA FLEXTOUCH) 100 UNIT/ML FlexTouch Pen Inject 35 Units into the skin at bedtime. 30 mL 1   losartan (COZAAR) 50 MG tablet Take 1 tablet (50 mg total) by mouth daily. 90 tablet 1   NOVOLOG FLEXPEN 100 UNIT/ML FlexPen 10-20 units 3 times daily with meals 15 mL 2   No facility-administered medications prior to visit.    No Known Allergies  ROS Review of Systems Review of Systems:  A fourteen system review of systems was performed and found to be positive as per HPI.  Objective:    Physical Exam General:  Pleasant and cooperative, in no acute distress Neuro:  Alert and oriented,  extra-ocular muscles intact  HEENT:  Normocephalic, atraumatic, neck supple Skin:  no gross rash, warm, pink. Cardiac:  RRR, S1 S2 Respiratory:  CTA B/L, Not using accessory muscles, speaking in full sentences- unlabored. Vascular:  Ext warm, no cyanosis apprec.; cap RF less 2 sec. No edema  Psych:  No HI/SI, judgement and insight good, Euthymic mood. Full Affect.  BP 129/78   Pulse 71   Temp 97.8 F (36.6 C)   Ht 6' 3"  (1.905 m)   Wt 276 lb 1.6 oz (125.2 kg)   SpO2 100%   BMI 34.51 kg/m  Wt Readings from Last 3 Encounters:   06/14/21 276 lb 1.6 oz (125.2 kg)  03/17/21 273 lb 14.4 oz (124.2 kg)  11/23/20 256 lb (116.1 kg)     Health Maintenance Due  Topic Date Due   PNEUMOCOCCAL POLYSACCHARIDE VACCINE AGE 59-64 HIGH RISK  Never done   Pneumococcal Vaccine 53-2 Years old (1 - PCV) Never done   OPHTHALMOLOGY EXAM  Never done   HPV VACCINES (2 - Male 3-dose series) 12/10/2012   Hepatitis C Screening  Never done   COVID-19 Vaccine (3 - Moderna risk series) 02/21/2020   FOOT EXAM  04/29/2021   INFLUENZA VACCINE  05/30/2021       Topic Date Due   HPV VACCINES (2 - Male 3-dose series) 12/10/2012    Lab Results  Component Value Date   TSH 3.540 03/17/2021   Lab Results  Component Value Date   WBC 4.6 03/17/2021   HGB 16.3 03/17/2021   HCT 47.3 03/17/2021   MCV 87 03/17/2021   PLT 213 03/17/2021   Lab Results  Component Value Date   NA 139 03/17/2021   K 5.0 03/17/2021   CO2 23 03/17/2021   GLUCOSE 126 (H) 03/17/2021   BUN 16 03/17/2021   CREATININE 1.10 03/17/2021   BILITOT 0.6 03/17/2021   ALKPHOS 74 03/17/2021   AST 24 03/17/2021   ALT 27 03/17/2021   PROT 7.0 03/17/2021   ALBUMIN 4.7 03/17/2021   CALCIUM 9.2 03/17/2021   EGFR 95 03/17/2021   Lab Results  Component Value Date   CHOL 136 03/17/2021   Lab Results  Component Value Date   HDL 53 03/17/2021   Lab Results  Component Value Date   LDLCALC 68 03/17/2021   Lab Results  Component Value Date   TRIG 73 03/17/2021   Lab Results  Component Value Date   CHOLHDL 2.6 03/17/2021   Lab Results  Component Value Date   HGBA1C 8.9 (A) 06/14/2021      Assessment & Plan:   Problem List Items Addressed This Visit       Cardiovascular and Mediastinum   Hypertension associated with diabetes ([place])    -Stable. -Continue current medication regimen. Will collect CMP next follow up visit. -Continue good hydration. -Will continue to monitor.        Endocrine   Type 1 diabetes mellitus with other specified complication  (HCC) - Primary    -A1c today 8.9, unchanged. Recommend to improve adherence with reducing carbohydrate and glucose intake. Continue ambulatory glucose monitoring and monitor for hypoglycemia. Discussed with patient adjusting Tresiba to 38 units if FBS continue to be consistently >140, patient verbalized understanding and will notify the clinic. Continue current medication regimen. Continue physical activity. -Will continue to monitor.      Relevant Orders  POCT glycosylated hemoglobin (Hb A1C) (Completed)     Other   Vitamin D insufficiency (Chronic)    -Last Vitamin D 24.9, will repeat Vitamin D next OV. Continue Vitamin D3 supplement.       No orders of the defined types were placed in this encounter.   Follow-up: Return in about 3 months (around 09/14/2021) for DM, HTN, HLD and FBW include Vit D 1 wk prior .    Lorrene Reid, PA-C

## 2021-06-14 NOTE — Assessment & Plan Note (Signed)
-  Stable. -Continue current medication regimen. Will collect CMP next follow up visit. -Continue good hydration. -Will continue to monitor.

## 2021-06-14 NOTE — Assessment & Plan Note (Signed)
-  Last Vitamin D 24.9, will repeat Vitamin D next OV. Continue Vitamin D3 supplement.

## 2021-06-16 ENCOUNTER — Ambulatory Visit: Payer: BC Managed Care – PPO | Admitting: Physician Assistant

## 2021-06-22 ENCOUNTER — Encounter: Payer: Self-pay | Admitting: Physician Assistant

## 2021-06-22 ENCOUNTER — Other Ambulatory Visit: Payer: Self-pay | Admitting: Physician Assistant

## 2021-06-22 DIAGNOSIS — E10649 Type 1 diabetes mellitus with hypoglycemia without coma: Secondary | ICD-10-CM

## 2021-09-02 ENCOUNTER — Other Ambulatory Visit: Payer: Self-pay

## 2021-09-02 DIAGNOSIS — E1159 Type 2 diabetes mellitus with other circulatory complications: Secondary | ICD-10-CM

## 2021-09-02 DIAGNOSIS — E781 Pure hyperglyceridemia: Secondary | ICD-10-CM

## 2021-09-02 DIAGNOSIS — I152 Hypertension secondary to endocrine disorders: Secondary | ICD-10-CM

## 2021-09-02 DIAGNOSIS — E10649 Type 1 diabetes mellitus with hypoglycemia without coma: Secondary | ICD-10-CM

## 2021-09-02 DIAGNOSIS — E1069 Type 1 diabetes mellitus with other specified complication: Secondary | ICD-10-CM

## 2021-09-02 DIAGNOSIS — E559 Vitamin D deficiency, unspecified: Secondary | ICD-10-CM

## 2021-09-02 DIAGNOSIS — E039 Hypothyroidism, unspecified: Secondary | ICD-10-CM

## 2021-09-02 MED ORDER — NOVOLOG FLEXPEN 100 UNIT/ML ~~LOC~~ SOPN: PEN_INJECTOR | SUBCUTANEOUS | 0 refills | Status: DC

## 2021-09-05 ENCOUNTER — Other Ambulatory Visit: Payer: BC Managed Care – PPO

## 2021-09-05 ENCOUNTER — Other Ambulatory Visit: Payer: Self-pay

## 2021-09-05 DIAGNOSIS — E781 Pure hyperglyceridemia: Secondary | ICD-10-CM

## 2021-09-05 DIAGNOSIS — E039 Hypothyroidism, unspecified: Secondary | ICD-10-CM

## 2021-09-05 DIAGNOSIS — E1069 Type 1 diabetes mellitus with other specified complication: Secondary | ICD-10-CM

## 2021-09-05 DIAGNOSIS — E559 Vitamin D deficiency, unspecified: Secondary | ICD-10-CM

## 2021-09-05 DIAGNOSIS — I152 Hypertension secondary to endocrine disorders: Secondary | ICD-10-CM

## 2021-09-06 ENCOUNTER — Encounter: Payer: Self-pay | Admitting: Physician Assistant

## 2021-09-06 ENCOUNTER — Ambulatory Visit (INDEPENDENT_AMBULATORY_CARE_PROVIDER_SITE_OTHER): Payer: BC Managed Care – PPO | Admitting: Physician Assistant

## 2021-09-06 VITALS — BP 122/86 | HR 70 | Temp 97.6°F | Ht 75.0 in | Wt 258.5 lb

## 2021-09-06 DIAGNOSIS — E1069 Type 1 diabetes mellitus with other specified complication: Secondary | ICD-10-CM | POA: Diagnosis not present

## 2021-09-06 DIAGNOSIS — E559 Vitamin D deficiency, unspecified: Secondary | ICD-10-CM | POA: Diagnosis not present

## 2021-09-06 DIAGNOSIS — E1159 Type 2 diabetes mellitus with other circulatory complications: Secondary | ICD-10-CM

## 2021-09-06 DIAGNOSIS — E109 Type 1 diabetes mellitus without complications: Secondary | ICD-10-CM

## 2021-09-06 DIAGNOSIS — Z23 Encounter for immunization: Secondary | ICD-10-CM | POA: Diagnosis not present

## 2021-09-06 DIAGNOSIS — E039 Hypothyroidism, unspecified: Secondary | ICD-10-CM

## 2021-09-06 DIAGNOSIS — E10649 Type 1 diabetes mellitus with hypoglycemia without coma: Secondary | ICD-10-CM

## 2021-09-06 DIAGNOSIS — E786 Lipoprotein deficiency: Secondary | ICD-10-CM

## 2021-09-06 DIAGNOSIS — I152 Hypertension secondary to endocrine disorders: Secondary | ICD-10-CM

## 2021-09-06 LAB — CBC WITH DIFFERENTIAL/PLATELET
Basophils Absolute: 0 10*3/uL (ref 0.0–0.2)
Basos: 1 %
EOS (ABSOLUTE): 0.1 10*3/uL (ref 0.0–0.4)
Eos: 2 %
Hematocrit: 45.3 % (ref 37.5–51.0)
Hemoglobin: 15.7 g/dL (ref 13.0–17.7)
Immature Grans (Abs): 0 10*3/uL (ref 0.0–0.1)
Immature Granulocytes: 1 %
Lymphocytes Absolute: 1.7 10*3/uL (ref 0.7–3.1)
Lymphs: 29 %
MCH: 29.3 pg (ref 26.6–33.0)
MCHC: 34.7 g/dL (ref 31.5–35.7)
MCV: 85 fL (ref 79–97)
Monocytes Absolute: 0.3 10*3/uL (ref 0.1–0.9)
Monocytes: 5 %
Neutrophils Absolute: 3.6 10*3/uL (ref 1.4–7.0)
Neutrophils: 62 %
Platelets: 215 10*3/uL (ref 150–450)
RBC: 5.36 x10E6/uL (ref 4.14–5.80)
RDW: 11.7 % (ref 11.6–15.4)
WBC: 5.7 10*3/uL (ref 3.4–10.8)

## 2021-09-06 LAB — TSH: TSH: 2.59 u[IU]/mL (ref 0.450–4.500)

## 2021-09-06 LAB — LIPID PANEL
Chol/HDL Ratio: 2.7 ratio (ref 0.0–5.0)
Cholesterol, Total: 106 mg/dL (ref 100–199)
HDL: 39 mg/dL — ABNORMAL LOW (ref >39)
LDL Chol Calc (NIH): 50 mg/dL (ref 0–99)
Triglycerides: 85 mg/dL (ref 0–149)
VLDL Cholesterol Cal: 17 mg/dL (ref 5–40)

## 2021-09-06 LAB — VITAMIN D 25 HYDROXY (VIT D DEFICIENCY, FRACTURES): Vit D, 25-Hydroxy: 29.1 ng/mL — ABNORMAL LOW (ref 30.0–100.0)

## 2021-09-06 LAB — COMPREHENSIVE METABOLIC PANEL
ALT: 19 IU/L (ref 0–44)
AST: 19 IU/L (ref 0–40)
Albumin/Globulin Ratio: 1.8 (ref 1.2–2.2)
Albumin: 4.6 g/dL (ref 4.1–5.2)
Alkaline Phosphatase: 89 IU/L (ref 44–121)
BUN/Creatinine Ratio: 14 (ref 9–20)
BUN: 14 mg/dL (ref 6–20)
Bilirubin Total: 0.6 mg/dL (ref 0.0–1.2)
CO2: 25 mmol/L (ref 20–29)
Calcium: 9.5 mg/dL (ref 8.7–10.2)
Chloride: 99 mmol/L (ref 96–106)
Creatinine, Ser: 1.03 mg/dL (ref 0.76–1.27)
Globulin, Total: 2.6 g/dL (ref 1.5–4.5)
Glucose: 293 mg/dL — ABNORMAL HIGH (ref 70–99)
Potassium: 5 mmol/L (ref 3.5–5.2)
Sodium: 139 mmol/L (ref 134–144)
Total Protein: 7.2 g/dL (ref 6.0–8.5)
eGFR: 103 mL/min/{1.73_m2} (ref >59)

## 2021-09-06 LAB — HEMOGLOBIN A1C
Est. average glucose Bld gHb Est-mCnc: 217 mg/dL
Hgb A1c MFr Bld: 9.2 % — ABNORMAL HIGH (ref 4.8–5.6)

## 2021-09-06 MED ORDER — NOVOLOG FLEXPEN 100 UNIT/ML ~~LOC~~ SOPN: PEN_INJECTOR | SUBCUTANEOUS | 2 refills | Status: DC

## 2021-09-06 MED ORDER — TRESIBA FLEXTOUCH 100 UNIT/ML ~~LOC~~ SOPN: 38.0000 [IU] | PEN_INJECTOR | Freq: Every day | SUBCUTANEOUS | 1 refills | Status: DC

## 2021-09-06 NOTE — Patient Instructions (Signed)

## 2021-09-06 NOTE — Assessment & Plan Note (Signed)
-  Discussed most recent Vitamin D, 29.1. Recommend to improve adherence with Vitamin D3 5000 units supplement. -Will continue to monitor.

## 2021-09-06 NOTE — Progress Notes (Signed)
Established Patient Office Visit  Subjective:  Patient ID: Cristian Kennedy, male    DOB: 23-Mar-1996  Age: 25 y.o. MRN: [id number]  CC:  Chief Complaint  Patient presents with   Follow-up   Diabetes   Hyperlipidemia   Hypertension    HPI Cristian Kennedy presents for follow up on diabetes mellitus, hypertension and hypertriglyceridemia.  Diabetes: Pt denies increased urination or thirst. Pt reports medication compliance. Injecting 15 units of Novolog with most meals except breakfast. Injecting 35 units at bedtime with Tresiba. No hypoglycemic events. Checking glucose at home. FBS 150-180. Does not check sugar before or after meals. 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 States has been more diligent with monitoring carbohydrate and sugar intake. Does report high stress.  HTN: Pt denies chest pain, palpitations, dizziness, shortness of breath, or leg swelling. Taking medication as directed without side effects.  Vitamin D insufficiency: Does report missing doses of Vitamin D3 supplement.   Past Medical History:  Diagnosis Date   Family history of diabetes mellitus (DM) 06/30/16   Family history of early CAD- father died AMI age 56 06-30-16   Family history of essential hypertension June 30, 2016   Overweight (BMI 25.0-29.9) 06/30/2016    Past Surgical History:  Procedure Laterality Date   TONSILLECTOMY AND ADENOIDECTOMY     TYMPANOSTOMY TUBE PLACEMENT      Family History  Problem Relation Age of Onset   Cancer Mother        leukemia   Hypertension Mother    Alcohol abuse Father    Stroke Father    Alcohol abuse Paternal Uncle    Diabetes Maternal Grandmother    Hypertension Maternal Grandmother    Cancer Paternal Grandmother        stomach/liver   Diabetes Paternal Grandmother    Hypertension Paternal Grandmother    Alcohol abuse Paternal Grandfather     Social History   Socioeconomic History    Marital status: Single    Spouse name: Not on file   Number of children: Not on file   Years of education: Not on file   Highest education level: Not on file  Occupational History   Not on file  Tobacco Use   Smoking status: Never   Smokeless tobacco: Never  Substance and Sexual Activity   Alcohol use: No   Drug use: No   Sexual activity: Not Currently  Other Topics Concern   Not on file  Social History Narrative   Not on file   Social Determinants of Health   Financial Resource Strain: Not on file  Food Insecurity: Not on file  Transportation Needs: Not on file  Physical Activity: Not on file  Stress: Not on file  Social Connections: Not on file  Intimate Partner Violence: Not on file    Outpatient Medications Prior to Visit  Medication Sig Dispense Refill   Cholecalciferol (VITAMIN D3) 5000 units TABS 5,000 IU OTC vitamin D3 daily. 90 tablet 3   losartan (COZAAR) 50 MG tablet Take 1 tablet (50 mg total) by mouth daily. 90 tablet 1   NOVOLOG FLEXPEN 100 UNIT/ML FlexPen INJECT 10 TO 20 UNITS SUBCUTANEOUSLY THREE TIMES DAILY WITH MEALS 15 mL 0   insulin degludec (TRESIBA FLEXTOUCH) 100 UNIT/ML FlexTouch Pen Inject 35 Units into the skin at bedtime. 30 mL 1   No facility-administered medications prior to visit.    No Known Allergies  ROS Review of Systems  Review of Systems:  A fourteen system review of systems was performed and found to be positive as per HPI.   Objective:    Physical Exam General:  Pleasant and cooperative, in no acute distress  Neuro:  Alert and oriented,  extra-ocular muscles intact  HEENT:  Normocephalic, atraumatic, neck supple Skin:  no gross rash, warm, pink. Cardiac:  RRR Respiratory:  CTA B/L Vascular:  Ext warm, no cyanosis apprec.; cap RF less 2 sec. No gross edema Psych:  No HI/SI, judgement and insight good, Euthymic mood. Full Affect.  BP 122/86   Pulse 70   Temp 97.6 F (36.4 C)   Ht 6' 3"  (1.905 m)   Wt 258 lb 8 oz (117.3  kg)   SpO2 99%   BMI 32.31 kg/m  Wt Readings from Last 3 Encounters:  09/06/21 258 lb 8 oz (117.3 kg)  06/14/21 276 lb 1.6 oz (125.2 kg)  03/17/21 273 lb 14.4 oz (124.2 kg)     Health Maintenance Due  Topic Date Due   Pneumococcal Vaccine 27-46 Years old (1 - PCV) Never done   HPV VACCINES (2 - Male 3-dose series) 12/10/2012   Hepatitis C Screening  Never done   COVID-19 Vaccine (3 - Moderna risk series) 02/21/2020       Topic Date Due   HPV VACCINES (2 - Male 3-dose series) 12/10/2012    Lab Results  Component Value Date   TSH 2.590 09/05/2021   Lab Results  Component Value Date   WBC 5.7 09/05/2021   HGB 15.7 09/05/2021   HCT 45.3 09/05/2021   MCV 85 09/05/2021   PLT 215 09/05/2021   Lab Results  Component Value Date   NA 139 09/05/2021   K 5.0 09/05/2021   CO2 25 09/05/2021   GLUCOSE 293 (H) 09/05/2021   BUN 14 09/05/2021   CREATININE 1.03 09/05/2021   BILITOT 0.6 09/05/2021   ALKPHOS 89 09/05/2021   AST 19 09/05/2021   ALT 19 09/05/2021   PROT 7.2 09/05/2021   ALBUMIN 4.6 09/05/2021   CALCIUM 9.5 09/05/2021   EGFR 103 09/05/2021   Lab Results  Component Value Date   CHOL 106 09/05/2021   Lab Results  Component Value Date   HDL 39 (L) 09/05/2021   Lab Results  Component Value Date   LDLCALC 50 09/05/2021   Lab Results  Component Value Date   TRIG 85 09/05/2021   Lab Results  Component Value Date   CHOLHDL 2.7 09/05/2021   Lab Results  Component Value Date   HGBA1C 9.2 (H) 09/05/2021      Assessment & Plan:   Problem List Items Addressed This Visit       Cardiovascular and Mediastinum   Hypertension associated with diabetes ([place])    - Stable. -Continue current medication regimen. -Recent CMP, renal function and electrolytes normal. -Will continue to monitor.      Relevant Medications   insulin degludec (TRESIBA FLEXTOUCH) 100 UNIT/ML FlexTouch Pen     Endocrine   h/o Hypothyroidism (Chronic)    - Recent TSH 2.590,  within normal limits. -Will continue to monitor.      Type 1 diabetes mellitus with other specified complication (HCC) - Primary    - Recent A1c increased from 8.9 to 9.2, uncontrolled.  Discussed with patient treatment adjustments and recommend increasing Tresiba to 38 units and continue NovoLog 10-20 units 3 times daily with meals.  Advised to start checking glucose before meals which will help determine if  higher insulin dose is needed (sliding scale). Patient verbalized understanding. Reports has a specific glucometer and will bring his kit at follow up visit. Will contact patient and inquire if interested on [facility] and will provide sample. -Praised for 18 pound loss since last visit, encourage to continue with dietary changes and monitoring carbohydrate and glucose intake. -Will continue to monitor.      Relevant Medications   insulin degludec (TRESIBA FLEXTOUCH) 100 UNIT/ML FlexTouch Pen     Other   Vitamin D insufficiency (Chronic)    -Discussed most recent Vitamin D, 29.1. Recommend to improve adherence with Vitamin D3 5000 units supplement. -Will continue to monitor.      Other Visit Diagnoses     Need for influenza vaccination       Relevant Orders   Flu Vaccine QUAD 29moIM (Fluarix, Fluzone & Alfiuria Quad PF) (Completed)   Low HDL (under 40)          Low HDL: -Discussed most recent lipid panel, LDL 50 (at goal <70), HDL 39 (goal >39), -Discussed low fat diet and exercise. -Will continue to monitor.  Meds ordered this encounter  Medications   insulin degludec (TRESIBA FLEXTOUCH) 100 UNIT/ML FlexTouch Pen    Sig: Inject 38 Units into the skin at bedtime.    Dispense:  30 mL    Refill:  1    Order Specific Question:   Supervising Provider    Answer:   MBeatrice LecherD [[id number]]    Follow-up: Return in about 3 months (around 12/07/2021) for CPE and BW (cmp, a1c).   Note:  This note was prepared with assistance of Dragon voice recognition software.  Occasional wrong-word or sound-a-like substitutions may have occurred due to the inherent limitations of voice recognition software.  MLorrene Reid PA-C

## 2021-09-06 NOTE — Addendum Note (Signed)
Addended by: Mayer Masker on: 09/06/2021 05:29 PM   Modules accepted: Orders

## 2021-09-06 NOTE — Assessment & Plan Note (Signed)
-   Recent TSH 2.590, within normal limits. -Will continue to monitor.

## 2021-09-06 NOTE — Assessment & Plan Note (Addendum)
-  Recent A1c increased from 8.9 to 9.2, uncontrolled.  Discussed with patient treatment adjustments and recommend increasing Tresiba to 38 units and continue NovoLog 10-20 units 3 times daily with meals.  Advised to start checking glucose before meals which will help determine if higher insulin dose is needed (sliding scale). Patient verbalized understanding. Reports has a specific glucometer and will bring his kit at follow up visit. Will contact patient and inquire if interested on [facility] and will provide sample. -Praised for 18 pound loss since last visit, encourage to continue with dietary changes and monitoring carbohydrate and glucose intake. -Will continue to monitor.

## 2021-09-06 NOTE — Assessment & Plan Note (Signed)
-  Stable. -Continue current medication regimen. Recent CMP, renal function and electrolytes normal. -Will continue to monitor. 

## 2021-10-20 ENCOUNTER — Other Ambulatory Visit: Payer: Self-pay

## 2021-10-20 ENCOUNTER — Ambulatory Visit (INDEPENDENT_AMBULATORY_CARE_PROVIDER_SITE_OTHER): Payer: BC Managed Care – PPO | Admitting: Physician Assistant

## 2021-10-20 ENCOUNTER — Encounter: Payer: Self-pay | Admitting: Physician Assistant

## 2021-10-20 VITALS — BP 142/88 | HR 84 | Temp 98.3°F | Ht 75.0 in | Wt 259.0 lb

## 2021-10-20 DIAGNOSIS — J019 Acute sinusitis, unspecified: Secondary | ICD-10-CM | POA: Diagnosis not present

## 2021-10-20 DIAGNOSIS — R0981 Nasal congestion: Secondary | ICD-10-CM | POA: Diagnosis not present

## 2021-10-20 LAB — POCT INFLUENZA A/B
Influenza A, POC: NEGATIVE
Influenza B, POC: NEGATIVE

## 2021-10-20 MED ORDER — AMOXICILLIN-POT CLAVULANATE 875-125 MG PO TABS: 1.0000 | ORAL_TABLET | Freq: Two times a day (BID) | ORAL | 0 refills | Status: DC

## 2021-10-20 NOTE — Progress Notes (Signed)
Acute Office Visit  Subjective:    Patient ID: Cristian Kennedy, male    DOB: May 09, 1996, 25 y.o.   MRN: [id number]  Chief Complaint  Patient presents with   Acute Visit    HPI Patient is in today for c/o nasal congestion and unilateral sinus pressure and headache. Patient reports symptoms started Saturday (5 days ago) with sneezing and mild congestion. Started to feel better and then Tuesday started to feel bad again with upper respiratory symptoms. No fever, night sweats, chills, n/v/d, sore throat, cough, wheezing or shortness of breath.  Past Medical History:  Diagnosis Date   Family history of diabetes mellitus (DM) 2016/06/18   Family history of early CAD- father died AMI age 10 06/18/16   Family history of essential hypertension 18-Jun-2016   Overweight (BMI 25.0-29.9) Jun 18, 2016    Past Surgical History:  Procedure Laterality Date   TONSILLECTOMY AND ADENOIDECTOMY     TYMPANOSTOMY TUBE PLACEMENT      Family History  Problem Relation Age of Onset   Cancer Mother        leukemia   Hypertension Mother    Alcohol abuse Father    Stroke Father    Alcohol abuse Paternal Uncle    Diabetes Maternal Grandmother    Hypertension Maternal Grandmother    Cancer Paternal Grandmother        stomach/liver   Diabetes Paternal Grandmother    Hypertension Paternal Grandmother    Alcohol abuse Paternal Grandfather     Social History   Socioeconomic History   Marital status: Single    Spouse name: Not on file   Number of children: Not on file   Years of education: Not on file   Highest education level: Not on file  Occupational History   Not on file  Tobacco Use   Smoking status: Never   Smokeless tobacco: Never  Substance and Sexual Activity   Alcohol use: No   Drug use: No   Sexual activity: Not Currently  Other Topics Concern   Not on file  Social History Narrative   Not on file   Social Determinants of Health   Financial Resource Strain: Not on file  Food  Insecurity: Not on file  Transportation Needs: Not on file  Physical Activity: Not on file  Stress: Not on file  Social Connections: Not on file  Intimate Partner Violence: Not on file    Outpatient Medications Prior to Visit  Medication Sig Dispense Refill   Cholecalciferol (VITAMIN D3) 5000 units TABS 5,000 IU OTC vitamin D3 daily. 90 tablet 3   insulin degludec (TRESIBA FLEXTOUCH) 100 UNIT/ML FlexTouch Pen Inject 38 Units into the skin at bedtime. 30 mL 1   losartan (COZAAR) 50 MG tablet Take 1 tablet (50 mg total) by mouth daily. 90 tablet 1   NOVOLOG FLEXPEN 100 UNIT/ML FlexPen INJECT 10 TO 20 UNITS SUBCUTANEOUSLY THREE TIMES DAILY WITH MEALS 30 mL 2   No facility-administered medications prior to visit.    No Known Allergies  Review of Systems Review of Systems:  A fourteen system review of systems was performed and found to be positive as per HPI.    Objective:    Physical Exam General:  Well Developed, well nourished, appropriate for stated age.  Neuro:  Alert and oriented,  extra-ocular muscles intact  HEENT:  Normocephalic, atraumatic, sinus tenderness frontal (right sided), PERRL, normal conjunctiva, normal TM of left ear, mild erythema of left TM, negative Tragus sign bilaterally, slight yellow nasal  drainage with boggy turbinates b/l, mild erythema of posterior oropharynx, neck supple, no adenopathy  Skin:  no gross rash, warm, pink. Cardiac:  RRR, S1 S2 Respiratory: CTA B/L w/o wheezing, crackles or rales Vascular:  Ext warm, no cyanosis apprec.; cap RF less 2 sec. Psych:  No HI/SI, judgement and insight good, Euthymic mood. Full Affect.  BP (!) 142/88    Pulse 84    Temp 98.3 F (36.8 C)    Ht $R'6\' 3"'NB$  (1.905 m)    Wt 259 lb (117.5 kg)    SpO2 98%    BMI 32.37 kg/m  Wt Readings from Last 3 Encounters:  10/20/21 259 lb (117.5 kg)  09/06/21 258 lb 8 oz (117.3 kg)  06/14/21 276 lb 1.6 oz (125.2 kg)    Health Maintenance Due  Topic Date Due   Pneumococcal  Vaccine 75-85 Years old (1 - PCV) Never done   HPV VACCINES (2 - Male 3-dose series) 12/10/2012   Hepatitis C Screening  Never done   COVID-19 Vaccine (3 - Moderna risk series) 02/21/2020       Topic Date Due   HPV VACCINES (2 - Male 3-dose series) 12/10/2012     Lab Results  Component Value Date   TSH 2.590 09/05/2021   Lab Results  Component Value Date   WBC 5.7 09/05/2021   HGB 15.7 09/05/2021   HCT 45.3 09/05/2021   MCV 85 09/05/2021   PLT 215 09/05/2021   Lab Results  Component Value Date   NA 139 09/05/2021   K 5.0 09/05/2021   CO2 25 09/05/2021   GLUCOSE 293 (H) 09/05/2021   BUN 14 09/05/2021   CREATININE 1.03 09/05/2021   BILITOT 0.6 09/05/2021   ALKPHOS 89 09/05/2021   AST 19 09/05/2021   ALT 19 09/05/2021   PROT 7.2 09/05/2021   ALBUMIN 4.6 09/05/2021   CALCIUM 9.5 09/05/2021   EGFR 103 09/05/2021   Lab Results  Component Value Date   CHOL 106 09/05/2021   Lab Results  Component Value Date   HDL 39 (L) 09/05/2021   Lab Results  Component Value Date   LDLCALC 50 09/05/2021   Lab Results  Component Value Date   TRIG 85 09/05/2021   Lab Results  Component Value Date   CHOLHDL 2.7 09/05/2021   Lab Results  Component Value Date   HGBA1C 9.2 (H) 09/05/2021       Assessment & Plan:   Problem List Items Addressed This Visit   None Visit Diagnoses     Nasal congestion    -  Primary   Relevant Orders   POCT Influenza A/B (Completed)   Novel Coronavirus, NAA ([facility])   Acute non-recurrent sinusitis, unspecified location       Relevant Medications   amoxicillin-clavulanate (AUGMENTIN) 875-125 MG tablet      Patient has s/sx suggestive of sinusitis with worsening symptoms at day 5 so will start oral antibiotic therapy. Influenza test resulted negative, Covid-19 test pending. Recommend supportive care with nasal saline rinses, warm compresses, hydration and rest. Follow up if symptoms fail to improve or worsen.  Meds ordered this  encounter  Medications   amoxicillin-clavulanate (AUGMENTIN) 875-125 MG tablet    Sig: Take 1 tablet by mouth 2 (two) times daily.    Dispense:  20 tablet    Refill:  0    Order Specific Question:   Supervising Provider    Answer:   Beatrice Lecher D [[id number]]     Lorrene Reid, PA-C

## 2021-10-20 NOTE — Patient Instructions (Signed)

## 2021-10-21 LAB — SARS-COV-2, NAA 2 DAY TAT

## 2021-10-21 LAB — NOVEL CORONAVIRUS, NAA: SARS-CoV-2, NAA: DETECTED — AB

## 2021-12-20 NOTE — Progress Notes (Unsigned)
°  Established patient visit   Patient: Cristian Kennedy   DOB: Apr 12, 1996   26 y.o. Male  MRN: [id number] Visit Date: 12/22/2021  No chief complaint on file.  Subjective    HPI  *** Diabetes: Pt denies increased urination or thirst. Pt reports medication compliance. No hypoglycemic events. Checking glucose at home. FBS range from***  HTN: Pt denies chest pain, palpitations, dizziness or leg swelling. Taking medication as directed without side effects. Checks BP at home ***times/wk and readings range in ***. Pt follows a low salt diet.  HLD: Pt taking medication as directed without issues. Denies side effects including myalgias and RUQ pain.    Medications: Outpatient Medications Prior to Visit  Medication Sig   amoxicillin-clavulanate (AUGMENTIN) 875-125 MG tablet Take 1 tablet by mouth 2 (two) times daily.   Cholecalciferol (VITAMIN D3) 5000 units TABS 5,000 IU OTC vitamin D3 daily.   insulin degludec (TRESIBA FLEXTOUCH) 100 UNIT/ML FlexTouch Pen Inject 38 Units into the skin at bedtime.   losartan (COZAAR) 50 MG tablet Take 1 tablet (50 mg total) by mouth daily.   NOVOLOG FLEXPEN 100 UNIT/ML FlexPen INJECT 10 TO 20 UNITS SUBCUTANEOUSLY THREE TIMES DAILY WITH MEALS   No facility-administered medications prior to visit.    Review of Systems  {Labs   Heme   Chem   Endocrine   Serology   Results Review (optional):[id number]}   Objective    There were no vitals taken for this visit. BP Readings from Last 3 Encounters:  10/20/21 (!) 142/88  09/06/21 122/86  06/14/21 129/78   Wt Readings from Last 3 Encounters:  10/20/21 259 lb (117.5 kg)  09/06/21 258 lb 8 oz (117.3 kg)  06/14/21 276 lb 1.6 oz (125.2 kg)    Physical Exam  ***  No results found for any visits on 12/22/21.  Assessment & Plan     *** Problem List Items Addressed This Visit   None   No follow-ups on file.        Mayer Masker, PA-C  [facility] at [facility] [phone]  (phone) [phone] (fax)  [facility]

## 2021-12-22 ENCOUNTER — Ambulatory Visit: Payer: BC Managed Care – PPO | Admitting: Physician Assistant

## 2021-12-23 ENCOUNTER — Ambulatory Visit: Payer: BC Managed Care – PPO | Admitting: Physician Assistant

## 2021-12-27 ENCOUNTER — Ambulatory Visit (INDEPENDENT_AMBULATORY_CARE_PROVIDER_SITE_OTHER): Payer: BC Managed Care – PPO | Admitting: Physician Assistant

## 2021-12-27 ENCOUNTER — Encounter: Payer: Self-pay | Admitting: Physician Assistant

## 2021-12-27 VITALS — BP 118/66

## 2021-12-27 DIAGNOSIS — E1159 Type 2 diabetes mellitus with other circulatory complications: Secondary | ICD-10-CM

## 2021-12-27 DIAGNOSIS — I152 Hypertension secondary to endocrine disorders: Secondary | ICD-10-CM

## 2021-12-27 DIAGNOSIS — E1069 Type 1 diabetes mellitus with other specified complication: Secondary | ICD-10-CM

## 2021-12-27 LAB — POCT GLYCOSYLATED HEMOGLOBIN (HGB A1C): Hemoglobin A1C: 8.6 % — AB (ref 4.0–5.6)

## 2021-12-27 MED ORDER — TRESIBA FLEXTOUCH 100 UNIT/ML ~~LOC~~ SOPN: 40.0000 [IU] | PEN_INJECTOR | Freq: Every day | SUBCUTANEOUS | 0 refills | Status: DC

## 2021-12-27 NOTE — Progress Notes (Signed)
Telehealth office visit note for Cristian Masker, PA-C- at Primary Care at [facility]   I connected with current patient today by telephone and verified that I am speaking with the correct person    Location of the patient: Home  Location of the provider: Office - This visit type was conducted due to national recommendations for restrictions regarding the COVID-19 Pandemic (e.g. social distancing) in an effort to limit this patient's exposure and mitigate transmission in our community.    - No physical exam could be performed with this format, beyond that communicated to [place] by the patient/ family members as noted.   - Additionally my office staff/ schedulers were to discuss with the patient that there may be a monetary charge related to this service, depending on their medical insurance.  My understanding is that patient understood and consented to proceed.     _________________________________________________________________________________   History of Present Illness: Patient calls in to follow up on diabetes mellitus and hypertension. Patient has no acute concerns.   Diabetes mellitus: Pt denies increased urination or thirst. Pt reports medication compliance. One hypoglycemic event at 58 which improved after eating granola bar. Checking glucose at home. 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 Reports not eating as late anymore.   HTN: Pt denies chest pain, palpitations, dizziness, shortness of breath, or lower extremity swelling. Taking medication as directed without side effects. Pt continues to monitor sodium intake. Continues to stay active with sports, outdoor activities and house work.     GAD 7 : Generalized Anxiety Score 10/20/2021 09/06/2021 06/14/2021  Nervous, Anxious, on Edge 0 0 0  Control/stop worrying 0 0 0  Worry too much - different things 0 0 0  Trouble relaxing 0 0 0  Restless 0 0 0  Easily annoyed or irritable 0 0 0  Afraid  - awful might happen 0 0 0  Total GAD 7 Score 0 0 0  Anxiety Difficulty Not difficult at all Not difficult at all -    Depression screen [facility] 2/9 10/20/2021 09/06/2021 06/14/2021 03/17/2021 11/23/2020  Decreased Interest 0 0 0 0 0  Down, Depressed, Hopeless 0 0 0 0 0  PHQ - 2 Score 0 0 0 0 0  Altered sleeping 0 0 0 0 0  Tired, decreased energy 0 0 0 0 0  Change in appetite 0 0 0 0 0  Feeling bad or failure about yourself  0 0 0 0 0  Trouble concentrating 0 0 0 0 0  Moving slowly or fidgety/restless 0 0 0 0 0  Suicidal thoughts 0 0 0 0 0  PHQ-9 Score 0 0 0 0 0  Difficult doing work/chores Not difficult at all Not difficult at all - - -  Some recent data might be hidden      Impression and Recommendations:     1. Type 1 diabetes mellitus with other specified complication (HCC)   2. Hypertension associated with diabetes (HCC)     Type 1 diabetes mellitus with other specified complication: -A1c has mildly improved from 9.2 to 8.6. Recommend to continue Novolog 10-20 units TID with meals and inject Tresiba 35-40 units at bedtime. Continue with avoid eating late and monitoring carbohydrate/sugar intake. Continue ambulatory glucose monitoring including hypoglycemia. Continue to stay active. Will continue to monitor.  Hypertension associated with diabetes: -BP today controlled. -Continue current medication regimen. See med list. -Will continue to monitor.   - As part of my  medical decision making, I reviewed the following data within the electronic MEDICAL RECORD NUMBER History obtained from pt /family, CMA notes reviewed and incorporated if applicable, Labs reviewed, Radiograph/ tests reviewed if applicable and OV notes from prior OV's with me, as well as any other specialists she/he has seen since seeing me last, were all reviewed and used in my medical decision making process today.    - Additionally, when appropriate, discussion had with patient regarding our treatment plan, and their  biases/concerns about that plan were used in my medical decision making today.    - The patient agreed with the plan and demonstrated an understanding of the instructions.   No barriers to understanding were identified.     - The patient was advised to call back or seek an in-person evaluation if the symptoms worsen or if the condition fails to improve as anticipated.   Return for DM, HTN in 3-4 months.    Orders Placed This Encounter  Procedures   POCT glycosylated hemoglobin (Hb A1C)    Meds ordered this encounter  Medications   insulin degludec (TRESIBA FLEXTOUCH) 100 UNIT/ML FlexTouch Pen    Sig: Inject 40 Units into the skin at bedtime.    Dispense:  45 mL    Refill:  0    Order Specific Question:   Supervising Provider    Answer:   Nani Gasser D [[id number]]    Medications Discontinued During This Encounter  Medication Reason   amoxicillin-clavulanate (AUGMENTIN) 875-125 MG tablet Completed Course   insulin degludec (TRESIBA FLEXTOUCH) 100 UNIT/ML FlexTouch Pen        Time spent on telephone encounter care was 13 minutes.      The 21st Century Cures Act was signed into law in 2016 which includes the topic of electronic health records.  This provides immediate access to information in MyChart.  This includes consultation notes, operative notes, office notes, lab results and pathology reports.  If you have any questions about what you read please let us know at your next visit or call us at the office.  We are right here with you.   __________________________________________________________________________________     Patient Care Team    Relationship Specialty Notifications Start End  Cristian Kennedy, [place] PCP - General   02/29/20      -Vitals obtained; medications/ allergies reconciled;  personal medical, social, Sx etc.histories were updated by CMA, reviewed by me and are reflected in chart   Patient Active Problem List   Diagnosis Date Noted   Patient  on ketogenic diet 10/16/2019   Type 1 diabetes mellitus with other specified complication (HCC) 10/16/2019   Hypertension associated with diabetes (HCC) 01/24/2019   Borderline hypertension 02/21/2018   High risk medications (not anticoagulants) long-term use 02/21/2018   h/o Hypothyroidism 05/07/2017   Vitamin D insufficiency 06/25/2016   Elevated TSH 06/25/2016   Type 1 diabetes mellitus without complication (HCC) 06/13/2016   Hypertriglyceridemia, familial 06/13/2016   Family history of early CAD- father died AMI age 78 17-Jun-2016   Family history of essential hypertension 2016-06-17   Family history of diabetes mellitus (DM) 2016/06/17     No outpatient medications have been marked as taking for the 12/27/21 encounter (Office Visit) with Cristian Masker, PA-C.     Allergies:  No Known Allergies   ROS:  See above HPI for pertinent positives and negatives   Objective:   Blood pressure 118/66.  (if some vitals are omitted, this means that patient was UNABLE to obtain  them. ) General: A & O * 3; sounds in no acute distress; in usual state of health.  Respiratory: speaking in full sentences, no conversational dyspnea Psych: insight appears good, mood- appears full

## 2022-04-11 ENCOUNTER — Telehealth: Payer: Self-pay | Admitting: Physician Assistant

## 2022-04-11 NOTE — Telephone Encounter (Signed)
No, patient is supposed to be seen every 3-4 months for his Diabetes. Please advise him this and offer to schedule an appointment.

## 2022-04-11 NOTE — Telephone Encounter (Signed)
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 So he is just asking if he can get refills on diabetic medications until then? Please advise.

## 2022-04-11 NOTE — Telephone Encounter (Signed)
Patient aware.

## 2022-04-14 NOTE — Patient Instructions (Signed)
Diabetes Mellitus and Foot Care Foot care is an important part of your health, especially when you have diabetes. Diabetes may cause you to have problems because of poor blood flow (circulation) to your feet and legs, which can cause your skin to: Become thinner and drier. Break more easily. Heal more slowly. Peel and crack. You may also have nerve damage (neuropathy) in your legs and feet, causing decreased feeling in them. This means that you may not notice minor injuries to your feet that could lead to more serious problems. Noticing and addressing any potential problems early is the best way to prevent future foot problems. How to care for your feet Foot hygiene  Wash your feet daily with warm water and mild soap. Do not use hot water. Then, pat your feet and the areas between your toes until they are completely dry. Do not soak your feet as this can dry your skin. Trim your toenails straight across. Do not dig under them or around the cuticle. File the edges of your nails with an emery board or nail file. Apply a moisturizing lotion or petroleum jelly to the skin on your feet and to dry, brittle toenails. Use lotion that does not contain alcohol and is unscented. Do not apply lotion between your toes. Shoes and socks Wear clean socks or stockings every day. Make sure they are not too tight. Do not wear knee-high stockings since they may decrease blood flow to your legs. Wear shoes that fit properly and have enough cushioning. Always look in your shoes before you put them on to be sure there are no objects inside. To break in new shoes, wear them for just a few hours a day. This prevents injuries on your feet. Wounds, scrapes, corns, and calluses  Check your feet daily for blisters, cuts, bruises, sores, and redness. If you cannot see the bottom of your feet, use a mirror or ask someone for help. Do not cut corns or calluses or try to remove them with medicine. If you find a minor scrape,  cut, or break in the skin on your feet, keep it and the skin around it clean and dry. You may clean these areas with mild soap and water. Do not clean the area with peroxide, alcohol, or iodine. If you have a wound, scrape, corn, or callus on your foot, look at it several times a day to make sure it is healing and not infected. Check for: Redness, swelling, or pain. Fluid or blood. Warmth. Pus or a bad smell. General tips Do not cross your legs. This may decrease blood flow to your feet. Do not use heating pads or hot water bottles on your feet. They may burn your skin. If you have lost feeling in your feet or legs, you may not know this is happening until it is too late. Protect your feet from hot and cold by wearing shoes, such as at the beach or on hot pavement. Schedule a complete foot exam at least once a year (annually) or more often if you have foot problems. Report any cuts, sores, or bruises to your health care provider immediately. Where to find more information American Diabetes Association: www.diabetes.org Association of Diabetes Care & Education Specialists: www.diabeteseducator.org Contact a health care provider if: You have a medical condition that increases your risk of infection and you have any cuts, sores, or bruises on your feet. You have an injury that is not healing. You have redness on your legs or feet. You   feel burning or tingling in your legs or feet. You have pain or cramps in your legs and feet. Your legs or feet are numb. Your feet always feel cold. You have pain around any toenails. Get help right away if: You have a wound, scrape, corn, or callus on your foot and: You have pain, swelling, or redness that gets worse. You have fluid or blood coming from the wound, scrape, corn, or callus. Your wound, scrape, corn, or callus feels warm to the touch. You have pus or a bad smell coming from the wound, scrape, corn, or callus. You have a fever. You have a red  line going up your leg. Summary Check your feet every day for blisters, cuts, bruises, sores, and redness. Apply a moisturizing lotion or petroleum jelly to the skin on your feet and to dry, brittle toenails. Wear shoes that fit properly and have enough cushioning. If you have foot problems, report any cuts, sores, or bruises to your health care provider immediately. Schedule a complete foot exam at least once a year (annually) or more often if you have foot problems. This information is not intended to replace advice given to you by your health care provider. Make sure you discuss any questions you have with your health care provider. Document Revised: 05/06/2020 Document Reviewed: 05/06/2020 Elsevier Patient Education  2023 Elsevier Inc.  

## 2022-04-17 ENCOUNTER — Ambulatory Visit (INDEPENDENT_AMBULATORY_CARE_PROVIDER_SITE_OTHER): Payer: BC Managed Care – PPO | Admitting: Physician Assistant

## 2022-04-17 ENCOUNTER — Encounter: Payer: Self-pay | Admitting: Physician Assistant

## 2022-04-17 VITALS — BP 127/84 | HR 71 | Ht 75.0 in | Wt 266.6 lb

## 2022-04-17 DIAGNOSIS — E1159 Type 2 diabetes mellitus with other circulatory complications: Secondary | ICD-10-CM | POA: Diagnosis not present

## 2022-04-17 DIAGNOSIS — E039 Hypothyroidism, unspecified: Secondary | ICD-10-CM

## 2022-04-17 DIAGNOSIS — E1069 Type 1 diabetes mellitus with other specified complication: Secondary | ICD-10-CM | POA: Diagnosis not present

## 2022-04-17 DIAGNOSIS — I152 Hypertension secondary to endocrine disorders: Secondary | ICD-10-CM | POA: Diagnosis not present

## 2022-04-17 LAB — POCT GLYCOSYLATED HEMOGLOBIN (HGB A1C)
HbA1c POC (<> result, manual entry): 9.3 % (ref 4.0–5.6)
HbA1c, POC (controlled diabetic range): 9.3 % — AB (ref 0.0–7.0)
HbA1c, POC (prediabetic range): 9.3 % — AB (ref 5.7–6.4)
Hemoglobin A1C: 9.3 % — AB (ref 4.0–5.6)

## 2022-04-17 LAB — POCT UA - MICROALBUMIN
Albumin/Creatinine Ratio, Urine, POC: <30
Creatinine, POC: 50 mg/dL
Microalbumin Ur, POC: 10 mg/L

## 2022-04-17 MED ORDER — TRESIBA FLEXTOUCH 100 UNIT/ML ~~LOC~~ SOPN: 40.0000 [IU] | PEN_INJECTOR | Freq: Every day | SUBCUTANEOUS | 0 refills | Status: DC

## 2022-04-17 MED ORDER — NOVOLOG FLEXPEN 100 UNIT/ML ~~LOC~~ SOPN: PEN_INJECTOR | SUBCUTANEOUS | 2 refills | Status: DC

## 2022-04-17 NOTE — Assessment & Plan Note (Addendum)
-  A1c today 9.3, uncontrolled and likely secondary to poor adherence to diabetic diet. Will increase Tresiba to 37-40 units at bedtime. Advised to continue using sliding scale for Novolog. Discussed improving diet and reducing carbohydrate/sugar intake. Will continue to monitor. -UA microalbumin obtained, nrl. Patient on ARB therapy.

## 2022-04-17 NOTE — Assessment & Plan Note (Signed)
-  Subclinical. Last TSH normal. Will repeat TSH with annual physical.

## 2022-04-17 NOTE — Progress Notes (Signed)
Established patient visit   Patient: Cristian Kennedy   DOB: Feb 04, 1996   26 y.o. Male  MRN: [id number] Visit Date: 04/17/2022  Chief Complaint  Patient presents with   Follow-up    Patient presents today to follow up on DM. He is feeling great. He knows his downfall eating late at night.    Subjective    HPI HPI     Follow-up    Additional comments: Patient presents today to follow up on DM. He is feeling great. He knows his downfall eating late at night.       Last edited by Pennelope Bracken, CMA on 04/17/2022  3:04 PM.      Patient presents for chronic follow-up.   Diabetes: Pt denies increased urination or thirst. Pt reports medication compliance- administering 10 units of Novolog with meals and 35 units at bedtime. No hypoglycemic events. Checking glucose at home. FBS average 200s. Patient reports poor diet the last few months secondary to stress.   HTN: Pt denies chest pain, palpitations, dizziness or lower extremity swelling. Taking medication as directed without side effects.     Medications: Outpatient Medications Prior to Visit  Medication Sig   Cholecalciferol (VITAMIN D3) 5000 units TABS 5,000 IU OTC vitamin D3 daily.   losartan (COZAAR) 50 MG tablet Take 1 tablet (50 mg total) by mouth daily.   [DISCONTINUED] insulin degludec (TRESIBA FLEXTOUCH) 100 UNIT/ML FlexTouch Pen Inject 40 Units into the skin at bedtime.   [DISCONTINUED] NOVOLOG FLEXPEN 100 UNIT/ML FlexPen INJECT 10 TO 20 UNITS SUBCUTANEOUSLY THREE TIMES DAILY WITH MEALS   No facility-administered medications prior to visit.    Review of Systems Review of Systems:  A fourteen system review of systems was performed and found to be positive as per HPI.  Last CBC Lab Results  Component Value Date   WBC 5.7 09/05/2021   HGB 15.7 09/05/2021   HCT 45.3 09/05/2021   MCV 85 09/05/2021   MCH 29.3 09/05/2021   RDW 11.7 09/05/2021   PLT 215 78/58/8502   Last metabolic panel Lab Results  Component  Value Date   GLUCOSE 293 (H) 09/05/2021   NA 139 09/05/2021   K 5.0 09/05/2021   CL 99 09/05/2021   CO2 25 09/05/2021   BUN 14 09/05/2021   CREATININE 1.03 09/05/2021   EGFR 103 09/05/2021   CALCIUM 9.5 09/05/2021   PROT 7.2 09/05/2021   ALBUMIN 4.6 09/05/2021   LABGLOB 2.6 09/05/2021   AGRATIO 1.8 09/05/2021   BILITOT 0.6 09/05/2021   ALKPHOS 89 09/05/2021   AST 19 09/05/2021   ALT 19 09/05/2021   Last lipids Lab Results  Component Value Date   CHOL 106 09/05/2021   HDL 39 (L) 09/05/2021   LDLCALC 50 09/05/2021   TRIG 85 09/05/2021   CHOLHDL 2.7 09/05/2021   Last hemoglobin A1c Lab Results  Component Value Date   HGBA1C 9.3 (A) 04/17/2022   HGBA1C 9.3 04/17/2022   HGBA1C 9.3 (A) 04/17/2022   HGBA1C 9.3 (A) 04/17/2022   Last thyroid functions Lab Results  Component Value Date   TSH 2.590 09/05/2021   T3TOTAL 137 06/17/2019   Last vitamin D Lab Results  Component Value Date   VD25OH 29.1 (L) 09/05/2021     Objective    BP 127/84   Pulse 71   Ht _0  (1.905 m)   Wt 266 lb 9.6 oz (120.9 kg)   SpO2 99%   BMI 33.32 kg/m  BP Readings from Last 3  Encounters:  04/17/22 127/84  12/27/21 118/66  10/20/21 (!) 142/88   Wt Readings from Last 3 Encounters:  04/17/22 266 lb 9.6 oz (120.9 kg)  10/20/21 259 lb (117.5 kg)  09/06/21 258 lb 8 oz (117.3 kg)    Physical Exam  General:  Well Developed, well nourished, appropriate for stated age.  Neuro:  Alert and oriented,  extra-ocular muscles intact  HEENT:  Normocephalic, atraumatic, neck supple  Skin:  no gross rash, warm, pink. Cardiac:  RRR, S1 S2 Respiratory: CTA B/L  Vascular:  Ext warm, no cyanosis apprec.; cap RF less 2 sec. Psych:  No HI/SI, judgement and insight good, Euthymic mood. Full Affect.   Results for orders placed or performed in visit on 04/17/22  POCT glycosylated hemoglobin (Hb A1C)  Result Value Ref Range   Hemoglobin A1C 9.3 (A) 4.0 - 5.6 %   HbA1c POC (<> result, manual entry)  9.3 4.0 - 5.6 %   HbA1c, POC (prediabetic range) 9.3 (A) 5.7 - 6.4 %   HbA1c, POC (controlled diabetic range) 9.3 (A) 0.0 - 7.0 %  POCT UA - Microalbumin  Result Value Ref Range   Microalbumin Ur, POC 10 mg/L   Creatinine, POC 50 mg/dL   Albumin/Creatinine Ratio, Urine, POC <30     Assessment & Plan      Problem List Items Addressed This Visit       Cardiovascular and Mediastinum   Hypertension associated with diabetes ([place])    -Stable. -Continue Losartan 50 mg daily. -Will collect CMP for medication monitoring with CPE.      Relevant Medications   insulin degludec (TRESIBA FLEXTOUCH) 100 UNIT/ML FlexTouch Pen   NOVOLOG FLEXPEN 100 UNIT/ML FlexPen     Endocrine   h/o Hypothyroidism (Chronic)    -Subclinical. Last TSH normal. Will repeat TSH with annual physical.       Type 1 diabetes mellitus with other specified complication (HCC) - Primary    -A1c today 9.3, uncontrolled and likely secondary to poor adherence to diabetic diet. Will increase Tresiba to 37-40 units at bedtime. Advised to continue using sliding scale for Novolog. Discussed improving diet and reducing carbohydrate/sugar intake. Will continue to monitor. -UA microalbumin obtained, nrl. Patient on ARB therapy.      Relevant Medications   insulin degludec (TRESIBA FLEXTOUCH) 100 UNIT/ML FlexTouch Pen   NOVOLOG FLEXPEN 100 UNIT/ML FlexPen   Other Relevant Orders   POCT glycosylated hemoglobin (Hb A1C) (Completed)   POCT UA - Microalbumin (Completed)    Return for CPE and FBW same day early August .        Ellouise Newer  [place] [facility] [phone] (phone) [phone] (fax)  [place]

## 2022-04-17 NOTE — Assessment & Plan Note (Signed)
-  Stable. -Continue Losartan 50 mg daily. -Will collect CMP for medication monitoring with CPE.

## 2022-06-12 ENCOUNTER — Ambulatory Visit (INDEPENDENT_AMBULATORY_CARE_PROVIDER_SITE_OTHER): Payer: BC Managed Care – PPO | Admitting: Physician Assistant

## 2022-06-12 ENCOUNTER — Encounter: Payer: Self-pay | Admitting: Physician Assistant

## 2022-06-12 VITALS — BP 116/86 | HR 73 | Temp 97.7°F | Ht 75.0 in | Wt 273.0 lb

## 2022-06-12 DIAGNOSIS — Z0000 Encounter for general adult medical examination without abnormal findings: Secondary | ICD-10-CM

## 2022-06-12 DIAGNOSIS — E1159 Type 2 diabetes mellitus with other circulatory complications: Secondary | ICD-10-CM | POA: Diagnosis not present

## 2022-06-12 DIAGNOSIS — I152 Hypertension secondary to endocrine disorders: Secondary | ICD-10-CM

## 2022-06-12 DIAGNOSIS — E039 Hypothyroidism, unspecified: Secondary | ICD-10-CM

## 2022-06-12 DIAGNOSIS — E1069 Type 1 diabetes mellitus with other specified complication: Secondary | ICD-10-CM

## 2022-06-12 NOTE — Progress Notes (Signed)
Complete physical exam   Patient: Cristian Kennedy   DOB: 19-Oct-1996   26 y.o. Male  MRN: [id number] Visit Date: 06/12/2022   Chief Complaint  Patient presents with   Annual Exam   Subjective    Cristian Kennedy is a 26 y.o. male who presents today for a complete physical exam.  He reports consuming a general diet.  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  He generally feels fairly well. He does not have additional problems to discuss today.     Past Medical History:  Diagnosis Date   Family history of diabetes mellitus (DM) 06-10-2016   Family history of early CAD- father died AMI age 88 2016-06-10   Family history of essential hypertension 10-Jun-2016   Overweight (BMI 25.0-29.9) 06-10-2016   Past Surgical History:  Procedure Laterality Date   TONSILLECTOMY AND ADENOIDECTOMY     TYMPANOSTOMY TUBE PLACEMENT     Social History   Socioeconomic History   Marital status: Single    Spouse name: Not on file   Number of children: Not on file   Years of education: Not on file   Highest education level: Not on file  Occupational History   Not on file  Tobacco Use   Smoking status: Never   Smokeless tobacco: Never  Substance and Sexual Activity   Alcohol use: No   Drug use: No   Sexual activity: Not Currently  Other Topics Concern   Not on file  Social History Narrative   Not on file   Social Determinants of Health   Financial Resource Strain: Not on file  Food Insecurity: Not on file  Transportation Needs: Not on file  Physical Activity: Not on file  Stress: Not on file  Social Connections: Not on file  Intimate Partner Violence: Not on file     Medications: Outpatient Medications Prior to Visit  Medication Sig   Cholecalciferol (VITAMIN D3) 5000 units TABS 5,000 IU OTC vitamin D3 daily.   insulin degludec (TRESIBA FLEXTOUCH) 100 UNIT/ML FlexTouch Pen Inject 40 Units into the skin at bedtime.   losartan (COZAAR) 50 MG tablet  Take 1 tablet (50 mg total) by mouth daily.   NOVOLOG FLEXPEN 100 UNIT/ML FlexPen INJECT 10 TO 20 UNITS SUBCUTANEOUSLY THREE TIMES DAILY WITH MEALS   No facility-administered medications prior to visit.    Review of Systems Review of Systems:  A fourteen system review of systems was performed and found to be positive as per HPI.  Last CBC Lab Results  Component Value Date   WBC 5.7 09/05/2021   HGB 15.7 09/05/2021   HCT 45.3 09/05/2021   MCV 85 09/05/2021   MCH 29.3 09/05/2021   RDW 11.7 09/05/2021   PLT 215 32/44/0102   Last metabolic panel Lab Results  Component Value Date   GLUCOSE 293 (H) 09/05/2021   NA 139 09/05/2021   K 5.0 09/05/2021   CL 99 09/05/2021   CO2 25 09/05/2021   BUN 14 09/05/2021   CREATININE 1.03 09/05/2021   EGFR 103 09/05/2021   CALCIUM 9.5 09/05/2021   PROT 7.2 09/05/2021   ALBUMIN 4.6 09/05/2021   LABGLOB 2.6 09/05/2021   AGRATIO 1.8 09/05/2021   BILITOT 0.6 09/05/2021   ALKPHOS 89 09/05/2021   AST 19 09/05/2021   ALT 19 09/05/2021   Last lipids Lab Results  Component Value Date   CHOL 106 09/05/2021   HDL 39 (L) 09/05/2021   LDLCALC 50 09/05/2021   TRIG  85 09/05/2021   CHOLHDL 2.7 09/05/2021   Last hemoglobin A1c Lab Results  Component Value Date   HGBA1C 9.3 (A) 04/17/2022   HGBA1C 9.3 04/17/2022   HGBA1C 9.3 (A) 04/17/2022   HGBA1C 9.3 (A) 04/17/2022   Last thyroid functions Lab Results  Component Value Date   TSH 2.590 09/05/2021   T3TOTAL 137 06/17/2019   Last vitamin D Lab Results  Component Value Date   VD25OH 29.1 (L) 09/05/2021      Objective     BP 116/86   Pulse 73   Temp 97.7 F (36.5 C)   Ht $R'6\' 3"'Friona$  (1.905 m)   Wt 273 lb (123.8 kg)   SpO2 100%   BMI 34.12 kg/m  BP Readings from Last 3 Encounters:  06/12/22 116/86  04/17/22 127/84  12/27/21 118/66   Wt Readings from Last 3 Encounters:  06/12/22 273 lb (123.8 kg)  04/17/22 266 lb 9.6 oz (120.9 kg)  10/20/21 259 lb (117.5 kg)      Physical  Exam   General Appearance:    Alert, cooperative, in no acute distress, appears stated age  Head:    Normocephalic, without obvious abnormality, atraumatic  Eyes:    PERRL, conjunctiva/corneas clear, EOM's intact, fundi    benign, both eyes       Ears:    Normal TM's and external ear canals, both ears  Nose:   Nares normal, septum midline, mucosa normal, no drainage   or sinus tenderness  Throat:   Lips, mucosa, and tongue normal; teeth and gums normal  Neck:   Supple, symmetrical, trachea midline, no adenopathy;       thyroid:  No enlargement/tenderness/nodules; no JVD  Back:     Symmetric, no curvature, ROM normal, no CVA tenderness  Lungs:     Clear to auscultation bilaterally, respirations unlabored  Chest wall:    No tenderness or deformity  Heart:    Normal heart rate. Normal rhythm. No murmurs, rubs, or gallops.  S1 and S2 normal  Abdomen:     Soft, non-tender, bowel sounds active all four quadrants,    no masses, no organomegaly  Genitalia:    deferred  Rectal:    deferred  Extremities:   All extremities are intact. No cyanosis or edema  Pulses:   2+ and symmetric all extremities  Skin:   Skin color, texture, turgor normal, no rashes or lesions  Lymph nodes:   Cervical and supraclavicular nodes normal  Neurologic:   CNII-XII grossly intact.     Last depression screening scores    06/12/2022    9:25 AM 10/20/2021   11:23 AM 09/06/2021    8:42 AM  PHQ 2/9 Scores  PHQ - 2 Score 0 0 0  PHQ- 9 Score 0 0 0   Last fall risk screening    06/12/2022    9:25 AM  [place] in the past year? 0  Number falls in past yr: 0  Injury with Fall? 0  Risk for fall due to : No Fall Risks  Follow up Falls evaluation completed     No results found for any visits on 06/12/22.  Assessment & Plan    Routine Health Maintenance and Physical Exam  Exercise Activities and Dietary recommendations -Discussed heart healthy diet low in fat and carbohydrates. Recommend moderate  exercise 150 mins/wk.  Immunization History  Administered Date(s) Administered   DTaP 04/26/1995, 06/25/1995, 08/31/1995, 07/25/1996   HPV Quadrivalent 04-27-1995, 11/12/2012   Hepatitis  A 11/12/2012   Hepatitis B Mar 07, 1995, 03/27/1995, 08/31/1995   Influenza,inj,Quad PF,6+ Mos 09/06/2021   Influenza-Unspecified 09/26/2016   MMR 02/24/1996, 05/17/2000   Meningococcal Conjugate 04/06/2006   Moderna Sars-Covid-2 Vaccination 12/27/2019, 01/24/2020   Rotavirus Monovalent 04/26/1995, 06/25/1995   Rotavirus Pentavalent 07/25/1996   Td 09/28/2016   Tdap 04/06/2006, 10/30/2012   Varicella 07/25/1996, 08/26/2007    Health Maintenance  Topic Date Due   HPV VACCINES (2 - Male 3-dose series) 12/10/2012   Hepatitis C Screening  Never done   COVID-19 Vaccine (3 - Moderna risk series) 02/21/2020   INFLUENZA VACCINE  05/30/2022   HIV Screening  05/07/2029 (Originally 02/14/2010)   OPHTHALMOLOGY EXAM  06/13/2022   FOOT EXAM  09/06/2022   HEMOGLOBIN A1C  10/17/2022   TETANUS/TDAP  09/28/2026    Discussed health benefits of physical activity, and encouraged him to engage in regular exercise appropriate for his age and condition.  Problem List Items Addressed This Visit       Cardiovascular and Mediastinum   Hypertension associated with diabetes ([place])   Relevant Orders   CBC   Comprehensive metabolic panel   TSH   Lipid panel   Hemoglobin A1c     Endocrine   h/o Hypothyroidism (Chronic)   Relevant Orders   CBC   Comprehensive metabolic panel   TSH   Lipid panel   Hemoglobin A1c   Type 1 diabetes mellitus with other specified complication (HCC)   Relevant Orders   CBC   Comprehensive metabolic panel   TSH   Lipid panel   Hemoglobin A1c   Other Visit Diagnoses     Healthcare maintenance    -  Primary   Relevant Orders   CBC   Comprehensive metabolic panel   TSH   Lipid panel   Hemoglobin A1c      Will obtain fasting labs. Deferred hep c screening. Patient reports  has been taking 35 units of Tresiba, has not increased to 37 units as we dicussed at last visit. Will await A1c results. BP elevated on intake, repeated with improvement.  Recommend to forward copy of today's eye exam.    Return in about 3 months (around 09/12/2022) for DM, HLD, thyroid.       Lorrene Reid, PA-C  [facility] at [facility] [phone] (phone) [phone] (fax)  [place]

## 2022-06-12 NOTE — Patient Instructions (Signed)
Preventive Care 21-27 Years Old, Male Preventive care refers to lifestyle choices and visits with your health care provider that can promote health and wellness. Preventive care visits are also called wellness exams. What can I expect for my preventive care visit? Counseling During your preventive care visit, your health care provider may ask about your: Medical history, including: Past medical problems. Family medical history. Current health, including: Emotional well-being. Home life and relationship well-being. Sexual activity. Lifestyle, including: Alcohol, nicotine or tobacco, and drug use. Access to firearms. Diet, exercise, and sleep habits. Safety issues such as seatbelt and bike helmet use. Sunscreen use. Work and work environment. Physical exam Your health care provider may check your: Height and weight. These may be used to calculate your BMI (body mass index). BMI is a measurement that tells if you are at a healthy weight. Waist circumference. This measures the distance around your waistline. This measurement also tells if you are at a healthy weight and may help predict your risk of certain diseases, such as type 2 diabetes and high blood pressure. Heart rate and blood pressure. Body temperature. Skin for abnormal spots. What immunizations do I need?  Vaccines are usually given at various ages, according to a schedule. Your health care provider will recommend vaccines for you based on your age, medical history, and lifestyle or other factors, such as travel or where you work. What tests do I need? Screening Your health care provider may recommend screening tests for certain conditions. This may include: Lipid and cholesterol levels. Diabetes screening. This is done by checking your blood sugar (glucose) after you have not eaten for a while (fasting). Hepatitis B test. Hepatitis C test. HIV (human immunodeficiency virus) test. STI (sexually transmitted infection)  testing, if you are at risk. Talk with your health care provider about your test results, treatment options, and if necessary, the need for more tests. Follow these instructions at home: Eating and drinking  Eat a healthy diet that includes fresh fruits and vegetables, whole grains, lean protein, and low-fat dairy products. Drink enough fluid to keep your urine pale yellow. Take vitamin and mineral supplements as recommended by your health care provider. Do not drink alcohol if your health care provider tells you not to drink. If you drink alcohol: Limit how much you have to 0-2 drinks a day. Know how much alcohol is in your drink. In the [place] one drink equals one 12 oz bottle of beer (355 mL), one 5 oz glass of wine (148 mL), or one 1 oz glass of hard liquor (44 mL). Lifestyle Brush your teeth every morning and night with fluoride toothpaste. Floss one time each day. Exercise for at least 30 minutes 5 or more days each week. Do not use any products that contain nicotine or tobacco. These products include cigarettes, chewing tobacco, and vaping devices, such as e-cigarettes. If you need help quitting, ask your health care provider. Do not use drugs. If you are sexually active, practice safe sex. Use a condom or other form of protection to prevent STIs. Find healthy ways to manage stress, such as: Meditation, yoga, or listening to music. Journaling. Talking to a trusted person. Spending time with friends and family. Minimize exposure to UV radiation to reduce your risk of skin cancer. Safety Always wear your seat belt while driving or riding in a vehicle. Do not drive: If you have been drinking alcohol. Do not ride with someone who has been drinking. If you have been using any mind-altering substances   or drugs. While texting. When you are tired or distracted. Wear a helmet and other protective equipment during sports activities. If you have firearms in your house, make sure you  follow all gun safety procedures. Seek help if you have been physically or sexually abused. What's next? Go to your health care provider once a year for an annual wellness visit. Ask your health care provider how often you should have your eyes and teeth checked. Stay up to date on all vaccines. This information is not intended to replace advice given to you by your health care provider. Make sure you discuss any questions you have with your health care provider. Document Revised: 04/13/2021 Document Reviewed: 04/13/2021 Elsevier Patient Education  2023 Elsevier Inc.  

## 2022-06-13 LAB — HEMOGLOBIN A1C
Est. average glucose Bld gHb Est-mCnc: 226 mg/dL
Hgb A1c MFr Bld: 9.5 % — ABNORMAL HIGH (ref 4.8–5.6)

## 2022-06-13 LAB — LIPID PANEL
Chol/HDL Ratio: 3.4 ratio (ref 0.0–5.0)
Cholesterol, Total: 154 mg/dL (ref 100–199)
HDL: 45 mg/dL (ref >39)
LDL Chol Calc (NIH): 77 mg/dL (ref 0–99)
Triglycerides: 188 mg/dL — ABNORMAL HIGH (ref 0–149)
VLDL Cholesterol Cal: 32 mg/dL (ref 5–40)

## 2022-06-13 LAB — CBC
Hematocrit: 49.4 % (ref 37.5–51.0)
Hemoglobin: 16.8 g/dL (ref 13.0–17.7)
MCH: 29.1 pg (ref 26.6–33.0)
MCHC: 34 g/dL (ref 31.5–35.7)
MCV: 86 fL (ref 79–97)
Platelets: 228 10*3/uL (ref 150–450)
RBC: 5.77 x10E6/uL (ref 4.14–5.80)
RDW: 12.3 % (ref 11.6–15.4)
WBC: 5.3 10*3/uL (ref 3.4–10.8)

## 2022-06-13 LAB — COMPREHENSIVE METABOLIC PANEL
ALT: 20 IU/L (ref 0–44)
AST: 18 IU/L (ref 0–40)
Albumin/Globulin Ratio: 1.7 (ref 1.2–2.2)
Albumin: 4.5 g/dL (ref 4.3–5.2)
Alkaline Phosphatase: 90 IU/L (ref 44–121)
BUN/Creatinine Ratio: 16 (ref 9–20)
BUN: 17 mg/dL (ref 6–20)
Bilirubin Total: 0.5 mg/dL (ref 0.0–1.2)
CO2: 26 mmol/L (ref 20–29)
Calcium: 9.5 mg/dL (ref 8.7–10.2)
Chloride: 96 mmol/L (ref 96–106)
Creatinine, Ser: 1.09 mg/dL (ref 0.76–1.27)
Globulin, Total: 2.7 g/dL (ref 1.5–4.5)
Glucose: 237 mg/dL — ABNORMAL HIGH (ref 70–99)
Potassium: 4.7 mmol/L (ref 3.5–5.2)
Sodium: 135 mmol/L (ref 134–144)
Total Protein: 7.2 g/dL (ref 6.0–8.5)
eGFR: 95 mL/min/{1.73_m2} (ref >59)

## 2022-06-13 LAB — TSH: TSH: 5.43 u[IU]/mL — ABNORMAL HIGH (ref 0.450–4.500)

## 2022-08-14 ENCOUNTER — Other Ambulatory Visit: Payer: Self-pay | Admitting: Physician Assistant

## 2022-08-14 DIAGNOSIS — E1069 Type 1 diabetes mellitus with other specified complication: Secondary | ICD-10-CM

## 2022-08-14 MED ORDER — TRESIBA FLEXTOUCH 100 UNIT/ML ~~LOC~~ SOPN: 40.0000 [IU] | PEN_INJECTOR | Freq: Every day | SUBCUTANEOUS | 0 refills | Status: DC

## 2022-09-07 ENCOUNTER — Encounter: Payer: Self-pay | Admitting: Physician Assistant

## 2022-09-07 DIAGNOSIS — E1069 Type 1 diabetes mellitus with other specified complication: Secondary | ICD-10-CM

## 2022-09-08 MED ORDER — FREESTYLE LITE TEST VI STRP: ORAL_STRIP | 12 refills | Status: DC

## 2022-09-08 NOTE — Addendum Note (Signed)
Addended by: Tonny Bollman on: 09/08/2022 08:59 AM   Modules accepted: Orders

## 2022-09-20 ENCOUNTER — Ambulatory Visit: Payer: BC Managed Care – PPO | Admitting: Physician Assistant

## 2022-09-20 ENCOUNTER — Ambulatory Visit: Payer: BC Managed Care – PPO | Admitting: Nurse Practitioner

## 2022-09-20 ENCOUNTER — Encounter: Payer: Self-pay | Admitting: Nurse Practitioner

## 2022-09-20 VITALS — BP 127/84 | HR 77 | Ht 75.0 in | Wt 273.2 lb

## 2022-09-20 DIAGNOSIS — E1069 Type 1 diabetes mellitus with other specified complication: Secondary | ICD-10-CM | POA: Diagnosis not present

## 2022-09-20 DIAGNOSIS — I152 Hypertension secondary to endocrine disorders: Secondary | ICD-10-CM | POA: Diagnosis not present

## 2022-09-20 DIAGNOSIS — E1065 Type 1 diabetes mellitus with hyperglycemia: Secondary | ICD-10-CM

## 2022-09-20 DIAGNOSIS — E1159 Type 2 diabetes mellitus with other circulatory complications: Secondary | ICD-10-CM

## 2022-09-20 LAB — POCT GLYCOSYLATED HEMOGLOBIN (HGB A1C): HbA1c POC (<> result, manual entry): 9.9 % (ref 4.0–5.6)

## 2022-09-20 MED ORDER — TRESIBA FLEXTOUCH 100 UNIT/ML ~~LOC~~ SOPN: 45.0000 [IU] | PEN_INJECTOR | Freq: Every day | SUBCUTANEOUS | 1 refills | Status: DC

## 2022-09-20 NOTE — Progress Notes (Signed)
Established patient visit   Patient: Cristian Kennedy   DOB: 1995-11-09   26 y.o. Male  MRN: [id number] Visit Date: 09/20/2022   Chief Complaint  Patient presents with   Follow-up   Subjective    HPI  Follow up visit.  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 Frequently eating at 11 pm  -diet has been "slack."    Medications: Outpatient Medications Prior to Visit  Medication Sig   Cholecalciferol (VITAMIN D3) 5000 units TABS 5,000 IU OTC vitamin D3 daily.   glucose blood (FREESTYLE LITE) test strip Test 3 times daily   losartan (COZAAR) 50 MG tablet Take 1 tablet (50 mg total) by mouth daily.   NOVOLOG FLEXPEN 100 UNIT/ML FlexPen INJECT 10 TO 20 UNITS SUBCUTANEOUSLY THREE TIMES DAILY WITH MEALS   [DISCONTINUED] insulin degludec (TRESIBA FLEXTOUCH) 100 UNIT/ML FlexTouch Pen Inject 40 Units into the skin at bedtime.   No facility-administered medications prior to visit.    Review of Systems  Constitutional:  Negative for activity change, chills, fatigue and fever.  HENT:  Negative for congestion, postnasal drip, rhinorrhea, sinus pressure, sinus pain, sneezing and sore throat.   Eyes: Negative.   Respiratory:  Negative for cough, shortness of breath and wheezing.   Cardiovascular:  Negative for chest pain and palpitations.  Gastrointestinal:  Negative for constipation, diarrhea, nausea and vomiting.  Endocrine: Negative for cold intolerance, heat intolerance, polydipsia and polyuria.       High blood sugars. Patient is type 1 diabetic.   Genitourinary:  Negative for dysuria, frequency and urgency.  Musculoskeletal:  Negative for back pain and myalgias.  Skin:  Negative for rash.  Allergic/Immunologic: Negative for environmental allergies.  Neurological:  Negative for dizziness, weakness and headaches.  Psychiatric/Behavioral:  The patient is not nervous/anxious.     Last  CBC Lab Results  Component Value Date   WBC 5.3 06/12/2022   HGB 16.8 06/12/2022   HCT 49.4 06/12/2022   MCV 86 06/12/2022   MCH 29.1 06/12/2022   RDW 12.3 06/12/2022   PLT 228 22/97/9892   Last metabolic panel Lab Results  Component Value Date   GLUCOSE 237 (H) 06/12/2022   NA 135 06/12/2022   K 4.7 06/12/2022   CL 96 06/12/2022   CO2 26 06/12/2022   BUN 17 06/12/2022   CREATININE 1.09 06/12/2022   EGFR 95 06/12/2022   CALCIUM 9.5 06/12/2022   PROT 7.2 06/12/2022   ALBUMIN 4.5 06/12/2022   LABGLOB 2.7 06/12/2022   AGRATIO 1.7 06/12/2022   BILITOT 0.5 06/12/2022   ALKPHOS 90 06/12/2022   AST 18 06/12/2022   ALT 20 06/12/2022   Last lipids Lab Results  Component Value Date   CHOL 154 06/12/2022   HDL 45 06/12/2022   LDLCALC 77 06/12/2022   TRIG 188 (H) 06/12/2022   CHOLHDL 3.4 06/12/2022   Last hemoglobin A1c Lab Results  Component Value Date   HGBA1C 9.9 09/20/2022   Last thyroid functions Lab Results  Component Value Date   TSH 4.860 (H) 09/20/2022   T3TOTAL 137 06/17/2019       Objective     Today's Vitals   09/20/22 1048  BP: 127/84  Pulse: 77  SpO2: 99%  Weight: 273 lb 3.2 oz (123.9 kg)  Height: _0  (1.905 m)   Body mass index is 34.15 kg/m.  BP Readings from Last 3 Encounters:  09/20/22  127/84  06/12/22 116/86  04/17/22 127/84    Wt Readings from Last 3 Encounters:  09/20/22 273 lb 3.2 oz (123.9 kg)  06/12/22 273 lb (123.8 kg)  04/17/22 266 lb 9.6 oz (120.9 kg)    Physical Exam Vitals and nursing note reviewed.  Constitutional:      Appearance: Normal appearance. He is well-developed.  HENT:     Head: Normocephalic and atraumatic.  Eyes:     Pupils: Pupils are equal, round, and reactive to light.  Cardiovascular:     Rate and Rhythm: Normal rate and regular rhythm.     Pulses: Normal pulses.     Heart sounds: Normal heart sounds.  Pulmonary:     Effort: Pulmonary effort is normal.     Breath sounds: Normal breath  sounds.  Abdominal:     Palpations: Abdomen is soft.  Musculoskeletal:        General: Normal range of motion.     Cervical back: Normal range of motion and neck supple.  Lymphadenopathy:     Cervical: No cervical adenopathy.  Skin:    General: Skin is warm and dry.     Capillary Refill: Capillary refill takes less than 2 seconds.  Neurological:     General: No focal deficit present.     Mental Status: He is alert and oriented to person, place, and time.  Psychiatric:        Mood and Affect: Mood normal.        Behavior: Behavior normal.        Thought Content: Thought content normal.        Judgment: Judgment normal.     Results for orders placed or performed in visit on 09/20/22  TSH + free T4  Result Value Ref Range   TSH 4.860 (H) 0.450 - 4.500 uIU/mL   Free T4 1.14 0.82 - 1.77 ng/dL  POCT glycosylated hemoglobin (Hb A1C)  Result Value Ref Range   Hemoglobin A1C     HbA1c POC (<> result, manual entry) 9.9 4.0 - 5.6 %   HbA1c, POC (prediabetic range)     HbA1c, POC (controlled diabetic range)      Assessment & Plan    1. Type 1 diabetes mellitus with other specified complication (HCC) [id number] more elevated today at 9.9. Increase Tresiba to 40 units for the next week then increase Tresiba to 45 units daily. We will recheck HgbA1c in 3 months.  - POCT glycosylated hemoglobin (Hb A1C) - TSH + free T4 - insulin degludec (TRESIBA FLEXTOUCH) 100 UNIT/ML FlexTouch Pen; Inject 45 Units into the skin at bedtime.  Dispense: 45 mL; Refill: 1  2. Hypertension associated with diabetes ([place]) Stable. Continue bp medication as prescribed  - POCT glycosylated hemoglobin (Hb A1C)   Problem List Items Addressed This Visit       Cardiovascular and Mediastinum   Hypertension associated with diabetes ([place])   Relevant Medications   insulin degludec (TRESIBA FLEXTOUCH) 100 UNIT/ML FlexTouch Pen   Other Relevant Orders   POCT glycosylated hemoglobin (Hb A1C) (Completed)     Endocrine    Type 1 diabetes mellitus with other specified complication (HCC) - Primary   Relevant Medications   insulin degludec (TRESIBA FLEXTOUCH) 100 UNIT/ML FlexTouch Pen   Other Relevant Orders   POCT glycosylated hemoglobin (Hb A1C) (Completed)   TSH + free T4 (Completed)     Return in about 3 months (around 12/21/2022) for diabetes with HgbA1c check - increased tresiba to 45 units. Marland Kitchen  Ronnell Freshwater, NP  [facility] at [facility] [phone] (phone) [phone] (fax)  [place]

## 2022-09-20 NOTE — Patient Instructions (Signed)
Increase Tresiba to 40 units for the next week then increase Tresiba to 45 units daily. We will recheck HgbA1c in 3 months.  My goal for you is 9.0 or better.  :)

## 2022-09-21 LAB — TSH+FREE T4
Free T4: 1.14 ng/dL (ref 0.82–1.77)
TSH: 4.86 u[IU]/mL — ABNORMAL HIGH (ref 0.450–4.500)

## 2022-10-25 ENCOUNTER — Encounter: Payer: Self-pay | Admitting: Nurse Practitioner

## 2022-10-26 ENCOUNTER — Other Ambulatory Visit: Payer: Self-pay

## 2022-10-26 DIAGNOSIS — E1069 Type 1 diabetes mellitus with other specified complication: Secondary | ICD-10-CM

## 2022-10-26 MED ORDER — NOVOLOG FLEXPEN 100 UNIT/ML ~~LOC~~ SOPN: PEN_INJECTOR | SUBCUTANEOUS | 2 refills | Status: DC

## 2022-12-21 ENCOUNTER — Encounter: Payer: Self-pay | Admitting: Nurse Practitioner

## 2022-12-21 ENCOUNTER — Ambulatory Visit: Payer: BC Managed Care – PPO | Admitting: Family Medicine

## 2022-12-21 ENCOUNTER — Encounter: Payer: Self-pay | Admitting: Family Medicine

## 2022-12-21 VITALS — BP 137/83 | HR 77 | Resp 20 | Ht 75.0 in | Wt 270.0 lb

## 2022-12-21 DIAGNOSIS — E781 Pure hyperglyceridemia: Secondary | ICD-10-CM | POA: Diagnosis not present

## 2022-12-21 DIAGNOSIS — E039 Hypothyroidism, unspecified: Secondary | ICD-10-CM

## 2022-12-21 DIAGNOSIS — I152 Hypertension secondary to endocrine disorders: Secondary | ICD-10-CM

## 2022-12-21 DIAGNOSIS — E1069 Type 1 diabetes mellitus with other specified complication: Secondary | ICD-10-CM

## 2022-12-21 DIAGNOSIS — E1159 Type 2 diabetes mellitus with other circulatory complications: Secondary | ICD-10-CM

## 2022-12-21 MED ORDER — TRESIBA FLEXTOUCH 100 UNIT/ML ~~LOC~~ SOPN: 45.0000 [IU] | PEN_INJECTOR | Freq: Every day | SUBCUTANEOUS | 1 refills | Status: DC

## 2022-12-21 NOTE — Assessment & Plan Note (Signed)
Drawing TSH and T4 today, at last check TSH was elevated with normal T4.

## 2022-12-21 NOTE — Assessment & Plan Note (Signed)
Managing with diet and exercise.  Drawing cholesterol panel today.  Will continue to monitor.

## 2022-12-21 NOTE — Progress Notes (Signed)
   Established Patient Office Visit  Subjective   Patient ID: Cristian Kennedy, male    DOB: 1995-12-14  Age: 27 y.o. MRN: [id number]  Chief Complaint  Patient presents with   Medical Management of Chronic Issues    Fasting   Diabetes    HPI Cristian Kennedy is a 27 y.o. male presenting today for follow up of diabetes. Diabetes: denies hypoglycemic events, wounds or sores that are not healing well, increased thirst or urination. Denies vision problems. Checking glucose at home, ranges have been low 100s. Taking Tresiba 40 units, Novolog as prescribed without any side effects.    ROS Negative unless otherwise noted in HPI   Objective:     BP 137/83 (BP Location: Right Arm, Patient Position: Sitting, Cuff Size: Large)   Pulse 77   Resp 20   Ht 6' 3"$  (1.905 m)   Wt 270 lb (122.5 kg)   SpO2 100%   BMI 33.75 kg/m   Physical Exam Constitutional:      General: He is not in acute distress.    Appearance: Normal appearance.  HENT:     Head: Normocephalic and atraumatic.  Cardiovascular:     Rate and Rhythm: Normal rate and regular rhythm.     Pulses: Normal pulses.     Heart sounds: Normal heart sounds.  Pulmonary:     Effort: Pulmonary effort is normal.     Breath sounds: Normal breath sounds.  Skin:    General: Skin is warm and dry.  Neurological:     Mental Status: He is alert and oriented to person, place, and time.  Psychiatric:        Mood and Affect: Mood normal.      Assessment & Plan:  Hypertension associated with diabetes ([place]) Assessment & Plan: Stable, continue losartan 50 mg daily.  He will let us know when he needs a refill.  CMP today.  Will continue to monitor.  Orders: -     Comprehensive metabolic panel; Future -     CBC with Differential/Platelet; Future  Type 1 diabetes mellitus with other specified complication [facility]) Assessment & Plan: Discussed increasing Tresiba to 45 units as recommended at his last appointment by [facility].  Renewed  prescription for 45 units which should be covered.  Checking A1c today, will adjust medication according to results.  Urine albumin 6 months ago within normal range.  Will continue to monitor.  Orders: -     Hemoglobin A1c; Future -     Tyler Aas FlexTouch; Inject 45 Units into the skin at bedtime.  Dispense: 45 mL; Refill: 1  Hypothyroidism, unspecified type Assessment & Plan: Drawing TSH and T4 today, at last check TSH was elevated with normal T4.  Orders: -     TSH; Future -     T4, free; Future  Hypertriglyceridemia, familial Assessment & Plan: Managing with diet and exercise.  Drawing cholesterol panel today.  Will continue to monitor.  Orders: -     Lipid panel; Future   Return in about 3 months (around 03/21/2023) for follow-up.    Velva Harman, PA

## 2022-12-21 NOTE — Assessment & Plan Note (Addendum)
Discussed increasing Tresiba to 45 units as recommended at his last appointment by [facility].  Renewed prescription for 45 units which should be covered.  Checking A1c today, will adjust medication according to results.  Urine albumin 6 months ago within normal range.  Will continue to monitor.  At next visit will recommend seeing endocrinology to potentially get a pump as that most often results in the most able blood sugar levels.

## 2022-12-21 NOTE — Assessment & Plan Note (Signed)
Stable, continue losartan 50 mg daily.  He will let us know when he needs a refill.  CMP today.  Will continue to monitor.

## 2022-12-22 ENCOUNTER — Other Ambulatory Visit: Payer: Self-pay | Admitting: Family Medicine

## 2022-12-22 DIAGNOSIS — R7989 Other specified abnormal findings of blood chemistry: Secondary | ICD-10-CM

## 2022-12-22 LAB — CBC WITH DIFFERENTIAL/PLATELET
Basophils Absolute: 0 10*3/uL (ref 0.0–0.2)
Basos: 1 %
EOS (ABSOLUTE): 0.1 10*3/uL (ref 0.0–0.4)
Eos: 2 %
Hematocrit: 46.9 % (ref 37.5–51.0)
Hemoglobin: 16 g/dL (ref 13.0–17.7)
Immature Grans (Abs): 0 10*3/uL (ref 0.0–0.1)
Immature Granulocytes: 0 %
Lymphocytes Absolute: 1.7 10*3/uL (ref 0.7–3.1)
Lymphs: 33 %
MCH: 28.7 pg (ref 26.6–33.0)
MCHC: 34.1 g/dL (ref 31.5–35.7)
MCV: 84 fL (ref 79–97)
Monocytes Absolute: 0.4 10*3/uL (ref 0.1–0.9)
Monocytes: 8 %
Neutrophils Absolute: 2.9 10*3/uL (ref 1.4–7.0)
Neutrophils: 56 %
Platelets: 211 10*3/uL (ref 150–450)
RBC: 5.58 x10E6/uL (ref 4.14–5.80)
RDW: 12.5 % (ref 11.6–15.4)
WBC: 5.1 10*3/uL (ref 3.4–10.8)

## 2022-12-22 LAB — COMPREHENSIVE METABOLIC PANEL
ALT: 23 IU/L (ref 0–44)
AST: 21 IU/L (ref 0–40)
Albumin/Globulin Ratio: 1.8 (ref 1.2–2.2)
Albumin: 4.4 g/dL (ref 4.3–5.2)
Alkaline Phosphatase: 82 IU/L (ref 44–121)
BUN/Creatinine Ratio: 16 (ref 9–20)
BUN: 17 mg/dL (ref 6–20)
Bilirubin Total: 0.8 mg/dL (ref 0.0–1.2)
CO2: 25 mmol/L (ref 20–29)
Calcium: 9.4 mg/dL (ref 8.7–10.2)
Chloride: 98 mmol/L (ref 96–106)
Creatinine, Ser: 1.04 mg/dL (ref 0.76–1.27)
Globulin, Total: 2.5 g/dL (ref 1.5–4.5)
Glucose: 233 mg/dL — ABNORMAL HIGH (ref 70–99)
Potassium: 4 mmol/L (ref 3.5–5.2)
Sodium: 136 mmol/L (ref 134–144)
Total Protein: 6.9 g/dL (ref 6.0–8.5)
eGFR: 101 mL/min/{1.73_m2} (ref >59)

## 2022-12-22 LAB — LIPID PANEL
Chol/HDL Ratio: 3.1 ratio (ref 0.0–5.0)
Cholesterol, Total: 159 mg/dL (ref 100–199)
HDL: 52 mg/dL (ref >39)
LDL Chol Calc (NIH): 86 mg/dL (ref 0–99)
Triglycerides: 117 mg/dL (ref 0–149)
VLDL Cholesterol Cal: 21 mg/dL (ref 5–40)

## 2022-12-22 LAB — TSH: TSH: 4.69 u[IU]/mL — ABNORMAL HIGH (ref 0.450–4.500)

## 2022-12-22 LAB — HEMOGLOBIN A1C
Est. average glucose Bld gHb Est-mCnc: 260 mg/dL
Hgb A1c MFr Bld: 10.7 % — ABNORMAL HIGH (ref 4.8–5.6)

## 2022-12-22 LAB — T4, FREE: Free T4: 1.22 ng/dL (ref 0.82–1.77)

## 2023-03-18 ENCOUNTER — Other Ambulatory Visit: Payer: Self-pay | Admitting: Nurse Practitioner

## 2023-03-18 DIAGNOSIS — E1069 Type 1 diabetes mellitus with other specified complication: Secondary | ICD-10-CM

## 2023-03-21 ENCOUNTER — Ambulatory Visit: Payer: BC Managed Care – PPO | Admitting: Physician Assistant

## 2023-03-22 ENCOUNTER — Encounter: Payer: Self-pay | Admitting: Family Medicine

## 2023-03-22 ENCOUNTER — Ambulatory Visit: Payer: BC Managed Care – PPO | Admitting: Family Medicine

## 2023-03-22 VITALS — BP 136/83 | HR 95 | Resp 18 | Ht 75.0 in | Wt 276.0 lb

## 2023-03-22 DIAGNOSIS — E1159 Type 2 diabetes mellitus with other circulatory complications: Secondary | ICD-10-CM

## 2023-03-22 DIAGNOSIS — Z794 Long term (current) use of insulin: Secondary | ICD-10-CM

## 2023-03-22 DIAGNOSIS — E781 Pure hyperglyceridemia: Secondary | ICD-10-CM | POA: Diagnosis not present

## 2023-03-22 DIAGNOSIS — E1069 Type 1 diabetes mellitus with other specified complication: Secondary | ICD-10-CM

## 2023-03-22 DIAGNOSIS — I152 Hypertension secondary to endocrine disorders: Secondary | ICD-10-CM | POA: Diagnosis not present

## 2023-03-22 DIAGNOSIS — E039 Hypothyroidism, unspecified: Secondary | ICD-10-CM | POA: Diagnosis not present

## 2023-03-22 MED ORDER — NOVOLOG FLEXPEN 100 UNIT/ML ~~LOC~~ SOPN
PEN_INJECTOR | SUBCUTANEOUS | 0 refills | Status: DC
Start: 2023-03-22 — End: 2023-04-10

## 2023-03-22 MED ORDER — TRESIBA FLEXTOUCH 100 UNIT/ML ~~LOC~~ SOPN
45.0000 [IU] | PEN_INJECTOR | Freq: Every day | SUBCUTANEOUS | 1 refills | Status: DC
Start: 2023-03-22 — End: 2023-06-19

## 2023-03-22 MED ORDER — LOSARTAN POTASSIUM 50 MG PO TABS: 50.0000 mg | ORAL_TABLET | Freq: Every day | ORAL | 1 refills | Status: DC

## 2023-03-22 NOTE — Assessment & Plan Note (Addendum)
BP goal <130/80.  Patient states that blood pressures at home have consistently been at goal, values in office slightly high but stable.  Continue losartan 50 mg daily.

## 2023-03-22 NOTE — Assessment & Plan Note (Signed)
Last TSH elevated, rechecking TSH and T4 today.

## 2023-03-22 NOTE — Progress Notes (Signed)
Established Patient Office Visit  Subjective   Patient ID: Cristian Kennedy, male    DOB: 1996/07/07  Age: 27 y.o. MRN: [id number]  Chief Complaint  Patient presents with   Diabetes   Hypertension    HPI Cristian Kennedy is a 27 y.o. male presenting today for follow up of type 1 diabetes, HTN, HLD. Diabetes: denies hypoglycemic events, wounds or sores that are not healing well, increased thirst or urination. Denies vision problems, eye exam due. Checking glucose at home, ranges have been low 100s fasting and mid-100s postprandial. Taking Tresiba 45 units at bedtime and Novolog 10-20 units sliding scale preprandial as prescribed without any side effects. Has been following low carb diet and staying very active. Hypertension: Patient here for follow-up of elevated blood pressure. He is exercising and is adherent to low salt diet.   Pt denies chest pain, SOB, dizziness, edema, syncope, fatigue or heart palpitations. Taking losartan, reports excellent compliance with treatment. Denies side effects. Hyperlipidemia: managing with diet and exercise. The ASCVD Risk score (Arnett DK, et al., 2019) failed to calculate for the following reasons:   The 2019 ASCVD risk score is only valid for ages 72 to 8  ROS Negative unless otherwise noted in HPI   Objective:     BP 136/83 (BP Location: Left Arm, Patient Position: Sitting, Cuff Size: Large)   Pulse 95   Resp 18   Ht 6\' 3"  (1.905 m)   Wt 276 lb (125.2 kg)   SpO2 99%   BMI 34.50 kg/m   Physical Exam Constitutional:      General: He is not in acute distress.    Appearance: Normal appearance.  HENT:     Head: Normocephalic and atraumatic.  Cardiovascular:     Rate and Rhythm: Normal rate and regular rhythm.     Pulses: Normal pulses.     Heart sounds: No murmur heard.    No friction rub. No gallop.  Pulmonary:     Effort: Pulmonary effort is normal. No respiratory distress.     Breath sounds: Normal breath sounds. No wheezing,  rhonchi or rales.  Skin:    General: Skin is warm and dry.  Neurological:     Mental Status: He is alert and oriented to person, place, and time.  Psychiatric:        Mood and Affect: Mood normal.        Behavior: Behavior normal.     Assessment & Plan:  Hypertension associated with diabetes (HCC) Assessment & Plan: BP goal <130/80.  Patient states that blood pressures at home have consistently been at goal, values in office slightly high but stable.  Continue losartan 50 mg daily.  Orders: -     CBC with Differential/Platelet; Future -     Comprehensive metabolic panel; Future -     Losartan Potassium; Take 1 tablet (50 mg total) by mouth daily.  Dispense: 90 tablet; Refill: 1  Hypertriglyceridemia, familial Assessment & Plan: Last lipid panel: LDL 86, HDL 52, triglycerides 117.  Continue with low-carb diet and exercise.  Will continue to monitor.  Orders: -     Lipid panel; Future  Type 1 diabetes mellitus with other specified complication (HCC) Assessment & Plan: A1c at last visit was 10.7.  He has been taking [place] 45 units consistently as instructed.  He also states that he has been making efforts with his diet to be more consistent with eating low-carb, hopeful that his A1c has gone down.  Discussed  referral to endocrinology for management of blood sugars given that A1c continues to increase, patient is agreeable.  Referral will be made, but patient did not schedule a 3-month follow-up with primary care in case there is a delay in starting care with endocrinology.  Orders: -     Hemoglobin A1c; Future -     CBC with Differential/Platelet; Future -     Comprehensive metabolic panel; Future -     Ambulatory referral to Endocrinology -     Evaristo Bury FlexTouch; Inject 45 Units into the skin at bedtime.  Dispense: 45 mL; Refill: 1 -     NovoLOG FlexPen; INJECT 10 TO 20 UNITS SUBCUTANEOUSLY THREE TIMES DAILY WITH MEALS  Dispense: 30 mL; Refill: 0  Hypothyroidism, unspecified  type Assessment & Plan: Last TSH elevated, rechecking TSH and T4 today.  Orders: -     TSH; Future -     T4, free; Future    Return in about 3 months (around 06/22/2023) for follow-up for DM, HTN, HLD, fasting blood work 1 week before.  Once established with endocrinology for care every 3 months for management of type 1 diabetes, he can follow-up with PCP every 6 months.   Melida Quitter, PA

## 2023-03-22 NOTE — Assessment & Plan Note (Signed)
A1c at last visit was 10.7.  He has been taking [place] 45 units consistently as instructed.  He also states that he has been making efforts with his diet to be more consistent with eating low-carb, hopeful that his A1c has gone down.  Discussed referral to endocrinology for management of blood sugars given that A1c continues to increase, patient is agreeable.  Referral will be made, but patient did not schedule a 45-month follow-up with primary care in case there is a delay in starting care with endocrinology.

## 2023-03-22 NOTE — Assessment & Plan Note (Signed)
Last lipid panel: LDL 86, HDL 52, triglycerides 117.  Continue with low-carb diet and exercise.  Will continue to monitor.

## 2023-03-23 ENCOUNTER — Telehealth: Payer: Self-pay

## 2023-03-23 LAB — T4, FREE: Free T4: 0.97 ng/dL (ref 0.82–1.77)

## 2023-03-23 LAB — CBC WITH DIFFERENTIAL/PLATELET
Basophils Absolute: 0 10*3/uL (ref 0.0–0.2)
Basos: 1 %
EOS (ABSOLUTE): 0.2 10*3/uL (ref 0.0–0.4)
Eos: 4 %
Hematocrit: 48.4 % (ref 37.5–51.0)
Hemoglobin: 16.2 g/dL (ref 13.0–17.7)
Immature Grans (Abs): 0 10*3/uL (ref 0.0–0.1)
Immature Granulocytes: 0 %
Lymphocytes Absolute: 1.6 10*3/uL (ref 0.7–3.1)
Lymphs: 36 %
MCH: 28.1 pg (ref 26.6–33.0)
MCHC: 33.5 g/dL (ref 31.5–35.7)
MCV: 84 fL (ref 79–97)
Monocytes Absolute: 0.4 10*3/uL (ref 0.1–0.9)
Monocytes: 10 %
Neutrophils Absolute: 2.3 10*3/uL (ref 1.4–7.0)
Neutrophils: 49 %
Platelets: 217 10*3/uL (ref 150–450)
RBC: 5.76 x10E6/uL (ref 4.14–5.80)
RDW: 13 % (ref 11.6–15.4)
WBC: 4.5 10*3/uL (ref 3.4–10.8)

## 2023-03-23 LAB — COMPREHENSIVE METABOLIC PANEL
ALT: 18 IU/L (ref 0–44)
AST: 21 IU/L (ref 0–40)
Albumin/Globulin Ratio: 2 (ref 1.2–2.2)
Albumin: 4.7 g/dL (ref 4.3–5.2)
Alkaline Phosphatase: 77 IU/L (ref 44–121)
BUN/Creatinine Ratio: 13 (ref 9–20)
BUN: 13 mg/dL (ref 6–20)
Bilirubin Total: 0.7 mg/dL (ref 0.0–1.2)
CO2: 27 mmol/L (ref 20–29)
Calcium: 9.8 mg/dL (ref 8.7–10.2)
Chloride: 100 mmol/L (ref 96–106)
Creatinine, Ser: 1.04 mg/dL (ref 0.76–1.27)
Globulin, Total: 2.4 g/dL (ref 1.5–4.5)
Glucose: 90 mg/dL (ref 70–99)
Potassium: 4.4 mmol/L (ref 3.5–5.2)
Sodium: 141 mmol/L (ref 134–144)
Total Protein: 7.1 g/dL (ref 6.0–8.5)
eGFR: 100 mL/min/{1.73_m2} (ref >59)

## 2023-03-23 LAB — LIPID PANEL
Chol/HDL Ratio: 3.3 ratio (ref 0.0–5.0)
Cholesterol, Total: 161 mg/dL (ref 100–199)
HDL: 49 mg/dL (ref >39)
LDL Chol Calc (NIH): 92 mg/dL (ref 0–99)
Triglycerides: 109 mg/dL (ref 0–149)
VLDL Cholesterol Cal: 20 mg/dL (ref 5–40)

## 2023-03-23 LAB — TSH: TSH: 4.17 u[IU]/mL (ref 0.450–4.500)

## 2023-03-23 LAB — HEMOGLOBIN A1C
Est. average glucose Bld gHb Est-mCnc: 220 mg/dL
Hgb A1c MFr Bld: 9.3 % — ABNORMAL HIGH (ref 4.8–5.6)

## 2023-03-23 NOTE — Telephone Encounter (Signed)
Ok. thank

## 2023-03-23 NOTE — Telephone Encounter (Signed)
Pt mother dropped off form and we scheduled an appointment for his TB test on upcoming Wednesday.

## 2023-04-06 ENCOUNTER — Other Ambulatory Visit: Payer: BC Managed Care – PPO

## 2023-04-06 DIAGNOSIS — Z111 Encounter for screening for respiratory tuberculosis: Secondary | ICD-10-CM

## 2023-04-07 LAB — QUANTIFERON-TB GOLD PLUS

## 2023-04-10 ENCOUNTER — Other Ambulatory Visit: Payer: Self-pay | Admitting: Family Medicine

## 2023-04-10 DIAGNOSIS — E1069 Type 1 diabetes mellitus with other specified complication: Secondary | ICD-10-CM

## 2023-04-11 LAB — QUANTIFERON-TB GOLD PLUS
QuantiFERON Mitogen Value: >10 IU/mL
QuantiFERON Nil Value: 0.19 IU/mL
QuantiFERON TB1 Ag Value: 0.07 IU/mL
QuantiFERON TB2 Ag Value: 0.07 IU/mL
QuantiFERON-TB Gold Plus: NEGATIVE

## 2023-04-24 NOTE — Telephone Encounter (Signed)
Pt come by to pick up the form.

## 2023-05-29 LAB — HM DIABETES EYE EXAM

## 2023-06-11 ENCOUNTER — Telehealth: Payer: Self-pay

## 2023-06-11 NOTE — Telephone Encounter (Signed)
Patient dropped off document  Insulin-treated DM Assessment form , to be filled out by provider. Patient requested to send it back via Call Patient to pick up within 7-days. Document is located in providers tray at front office.Please advise at Mobile [phone] (mobile)

## 2023-06-15 ENCOUNTER — Other Ambulatory Visit: Payer: BC Managed Care – PPO

## 2023-06-18 NOTE — Telephone Encounter (Signed)
Paperwork received.  Patient has upcoming appointment tomorrow, 06/19/2023, and I will fill it out and return to the patient then.

## 2023-06-19 ENCOUNTER — Encounter: Payer: Self-pay | Admitting: Family Medicine

## 2023-06-19 ENCOUNTER — Ambulatory Visit: Payer: BC Managed Care – PPO | Admitting: Family Medicine

## 2023-06-19 VITALS — BP 137/94 | HR 84 | Resp 18 | Ht 75.0 in | Wt 282.0 lb

## 2023-06-19 DIAGNOSIS — Z794 Long term (current) use of insulin: Secondary | ICD-10-CM | POA: Diagnosis not present

## 2023-06-19 DIAGNOSIS — I152 Hypertension secondary to endocrine disorders: Secondary | ICD-10-CM | POA: Diagnosis not present

## 2023-06-19 DIAGNOSIS — E1069 Type 1 diabetes mellitus with other specified complication: Secondary | ICD-10-CM | POA: Diagnosis not present

## 2023-06-19 LAB — POCT UA - MICROALBUMIN
Creatinine, POC: 50 mg/dL
Microalbumin Ur, POC: 10 mg/L

## 2023-06-19 LAB — POCT GLYCOSYLATED HEMOGLOBIN (HGB A1C): HbA1c POC (<> result, manual entry): 9.3 % (ref 4.0–5.6)

## 2023-06-19 MED ORDER — LOSARTAN POTASSIUM 50 MG PO TABS: 75.0000 mg | ORAL_TABLET | Freq: Every day | ORAL | 1 refills | Status: DC

## 2023-06-19 MED ORDER — TRESIBA FLEXTOUCH 100 UNIT/ML ~~LOC~~ SOPN
45.0000 [IU] | PEN_INJECTOR | Freq: Every day | SUBCUTANEOUS | 1 refills | Status: DC
Start: 2023-06-19 — End: 2023-09-04

## 2023-06-19 NOTE — Assessment & Plan Note (Signed)
BP goal <130/80.  Blood pressures have been gradually increasing the past year or so.  He is agreeable to increasing his dose of losartan to 75 mg daily.  This should maintain better control of blood pressure which will prevent complications from uncontrolled hypertension.

## 2023-06-19 NOTE — Assessment & Plan Note (Signed)
A1c stable at 9.3, same as last visit.  He has been waiting for his new insurance from [facility] to start, but has been in touch with the endocrinologist so they are aware of his situation.  He got a message today that enrollment for insurance has opened, and he will take care of that this week.  In the meantime, he did schedule a 16-month follow-up just in case endocrinology is booked out further than 3 months.  Until he sees endocrinology, recommend continuing Tresiba 45 units daily at bedtime.  Continue making efforts to follow low-carb diet and get routine physical activity.  Urine albumin/creatinine ratio slightly elevated, this is consistent with hyperglycemia.  I am hopeful that as A1c gets under control this ratio will decrease.

## 2023-06-19 NOTE — Progress Notes (Signed)
Established Patient Office Visit  Subjective   Patient ID: Cristian Kennedy, male    DOB: Feb 12, 1996  Age: 27 y.o. MRN: [id number]  Chief Complaint  Patient presents with   Diabetes   Hypertension    HPI Cristian Kennedy is a 27 y.o. male presenting today for follow up of hypertension, diabetes. Hypertension: Patient here for follow-up of elevated blood pressure. He is exercising and is adherent to low salt diet.   Pt denies chest pain, SOB, dizziness, edema, syncope, fatigue or heart palpitations. Taking losartan 50 mg daily, reports excellent compliance with treatment. Denies side effects. Diabetes: denies hypoglycemic events, wounds or sores that are not healing well, increased thirst or urination. Denies vision problems, eye exam completed 05/29/2023. Taking insulin 45 units at bedtime as prescribed without any side effects.  He has been consistent with physical activity, though he admits that he has not been adherent to low-carb diet as much as he would like to be.  He is hopeful that as school gets started and he gets back into a routine it will be easier to follow a low-carb diet in addition to his other efforts.  ROS Negative unless otherwise noted in HPI   Objective:     BP (!) 137/94 (BP Location: Left Arm, Patient Position: Sitting, Cuff Size: Large)   Pulse 84   Resp 18   Ht 6\' 3"  (1.905 m)   Wt 282 lb (127.9 kg)   SpO2 99%   BMI 35.25 kg/m   Physical Exam Constitutional:      General: He is not in acute distress.    Appearance: Normal appearance.  HENT:     Head: Normocephalic and atraumatic.  Cardiovascular:     Rate and Rhythm: Normal rate and regular rhythm.     Heart sounds: Normal heart sounds. No murmur heard.    No friction rub. No gallop.  Pulmonary:     Effort: Pulmonary effort is normal. No respiratory distress.     Breath sounds: Normal breath sounds. No wheezing, rhonchi or rales.  Skin:    General: Skin is warm and dry.  Neurological:     Mental  Status: He is alert and oriented to person, place, and time.  Psychiatric:        Mood and Affect: Mood normal.    Diabetic Foot Exam - Simple   Simple Foot Form Diabetic Foot exam was performed with the following findings: Yes 06/19/2023 12:50 PM  Visual Inspection No deformities, no ulcerations, no other skin breakdown bilaterally: Yes Sensation Testing Intact to touch and monofilament testing bilaterally: Yes Pulse Check Posterior Tibialis and Dorsalis pulse intact bilaterally: Yes Comments    Results for orders placed or performed in visit on 06/19/23  POCT UA - Microalbumin  Result Value Ref Range   Microalbumin Ur, POC 10 mg/L   Creatinine, POC 50 mg/dL   Albumin/Creatinine Ratio, Urine, POC 30-300   POCT HgB A1C  Result Value Ref Range   Hemoglobin A1C     HbA1c POC (<> result, manual entry) 9.3 4.0 - 5.6 %   HbA1c, POC (prediabetic range)     HbA1c, POC (controlled diabetic range)       Assessment & Plan:  Type 1 diabetes mellitus with other specified complication (HCC) Assessment & Plan: A1c stable at 9.3, same as last visit.  He has been waiting for his new insurance from [facility] to start, but has been in touch with the endocrinologist so they are aware  of his situation.  He got a message today that enrollment for insurance has opened, and he will take care of that this week.  In the meantime, he did schedule a 76-month follow-up just in case endocrinology is booked out further than 3 months.  Until he sees endocrinology, recommend continuing Tresiba 45 units daily at bedtime.  Continue making efforts to follow low-carb diet and get routine physical activity.  Urine albumin/creatinine ratio slightly elevated, this is consistent with hyperglycemia.  I am hopeful that as A1c gets under control this ratio will decrease.  Orders: -     POCT UA - Microalbumin -     POCT glycosylated hemoglobin (Hb A1C)  Hypertension associated with diabetes (HCC) Assessment &  Plan: BP goal <130/80.  Blood pressures have been gradually increasing the past year or so.  He is agreeable to increasing his dose of losartan to 75 mg daily.  This should maintain better control of blood pressure which will prevent complications from uncontrolled hypertension.   He has paperwork to be filled out that requires 3 months of blood glucose levels.  He does keep a log of this at home and will send photos of that log in order for the paperwork to be completed.  Return in about 11 weeks (around 09/04/2023) for follow-up for HTN, HLD, DM; 6 months for HTN, HLD, DM, fasting labs 1 week prior.    Melida Quitter, PA

## 2023-06-19 NOTE — Patient Instructions (Signed)
If you get in with endocrinology before November, just call or cancel your appointment in MyChart.  Send me pictures of the last 3 months of your blood sugar log on MyChart as soon as you can.  Start taking 1.5 tablets (75 mg) of losartan once daily for your blood pressure.

## 2023-08-29 ENCOUNTER — Other Ambulatory Visit: Payer: Self-pay | Admitting: Family Medicine

## 2023-08-29 DIAGNOSIS — E1069 Type 1 diabetes mellitus with other specified complication: Secondary | ICD-10-CM

## 2023-09-04 ENCOUNTER — Encounter: Payer: Self-pay | Admitting: Family Medicine

## 2023-09-04 ENCOUNTER — Ambulatory Visit: Payer: BC Managed Care – PPO | Admitting: Family Medicine

## 2023-09-04 VITALS — BP 125/81 | HR 87 | Resp 18 | Ht 75.0 in | Wt 282.0 lb

## 2023-09-04 DIAGNOSIS — E039 Hypothyroidism, unspecified: Secondary | ICD-10-CM

## 2023-09-04 DIAGNOSIS — E781 Pure hyperglyceridemia: Secondary | ICD-10-CM | POA: Diagnosis not present

## 2023-09-04 DIAGNOSIS — Z1159 Encounter for screening for other viral diseases: Secondary | ICD-10-CM

## 2023-09-04 DIAGNOSIS — Z794 Long term (current) use of insulin: Secondary | ICD-10-CM

## 2023-09-04 DIAGNOSIS — I152 Hypertension secondary to endocrine disorders: Secondary | ICD-10-CM

## 2023-09-04 DIAGNOSIS — E559 Vitamin D deficiency, unspecified: Secondary | ICD-10-CM

## 2023-09-04 DIAGNOSIS — E1069 Type 1 diabetes mellitus with other specified complication: Secondary | ICD-10-CM | POA: Diagnosis not present

## 2023-09-04 DIAGNOSIS — E1159 Type 2 diabetes mellitus with other circulatory complications: Secondary | ICD-10-CM

## 2023-09-04 LAB — POCT GLYCOSYLATED HEMOGLOBIN (HGB A1C): HbA1c POC (<> result, manual entry): 9.8 % (ref 4.0–5.6)

## 2023-09-04 MED ORDER — TRESIBA FLEXTOUCH 100 UNIT/ML ~~LOC~~ SOPN: 45.0000 [IU] | PEN_INJECTOR | Freq: Every day | SUBCUTANEOUS | 1 refills | Status: DC

## 2023-09-04 MED ORDER — LOSARTAN POTASSIUM 50 MG PO TABS: 75.0000 mg | ORAL_TABLET | Freq: Every day | ORAL | 1 refills | Status: DC

## 2023-09-04 MED ORDER — FREESTYLE LITE TEST VI STRP: ORAL_STRIP | 12 refills | Status: DC

## 2023-09-04 MED ORDER — NOVOLOG FLEXPEN 100 UNIT/ML ~~LOC~~ SOPN
PEN_INJECTOR | SUBCUTANEOUS | 0 refills | Status: DC
Start: 2023-09-04 — End: 2023-12-03

## 2023-09-04 NOTE — Progress Notes (Signed)
Established Patient Office Visit  Subjective   Patient ID: Cristian Kennedy, male    DOB: Cristian 18, 1997  Age: 27 y.o. MRN: [id number]  Chief Complaint  Patient presents with   Hypertension   Diabetes   Hyperlipidemia    HPI Cristian Kennedy is a 27 y.o. male presenting today for follow up of type 1 diabetes. Diabetes: denies hypoglycemic events, wounds or sores that are not healing well, increased thirst or urination. Checking glucose at home, fasting blood sugar today was 201. Taking Tresiba 45 units daily at bedtime and NovoLog 10-20 units 3 times daily with meals as prescribed without any side effects.  It is football season so he notes that he has not been eating or working out like he knows he should.  He was called by endocrinology but has not scheduled an appointment because it is football season.  Outpatient Medications Prior to Visit  Medication Sig   Cholecalciferol (VITAMIN D3) 5000 units TABS 5,000 IU OTC vitamin D3 daily.   [DISCONTINUED] glucose blood (FREESTYLE LITE) test strip Test 3 times daily   [DISCONTINUED] insulin degludec (TRESIBA FLEXTOUCH) 100 UNIT/ML FlexTouch Pen Inject 45 Units into the skin at bedtime.   [DISCONTINUED] losartan (COZAAR) 50 MG tablet Take 1.5 tablets (75 mg total) by mouth daily.   [DISCONTINUED] NOVOLOG FLEXPEN 100 UNIT/ML FlexPen INJECT 10 TO 20 UNITS SUBCUTANEOUSLY THREE TIMES DAILY WITH MEALS   No facility-administered medications prior to visit.    ROS Negative unless otherwise noted in HPI   Objective:     BP 125/81 (BP Location: Left Arm, Patient Position: Sitting, Cuff Size: Large)   Pulse 87   Resp 18   Ht 6\' 3"  (1.905 m)   Wt 282 lb (127.9 kg)   SpO2 98%   BMI 35.25 kg/m   Physical Exam Vitals reviewed.  Constitutional:      General: He is not in acute distress.    Appearance: Normal appearance. He is not ill-appearing.  HENT:     Head: Normocephalic and atraumatic.     Nose: Nose normal.  Eyes:      Conjunctiva/sclera: Conjunctivae normal.  Pulmonary:     Effort: Pulmonary effort is normal.  Musculoskeletal:     Cervical back: Normal range of motion.  Skin:    Coloration: Skin is not jaundiced or pale.     Findings: No bruising.  Neurological:     Mental Status: He is alert and oriented to person, place, and time.  Psychiatric:        Mood and Affect: Mood normal.    Results for orders placed or performed in visit on 09/04/23  POCT glycosylated hemoglobin (Hb A1C)  Result Value Ref Range   Hemoglobin A1C     HbA1c POC (<> result, manual entry) 9.8 4.0 - 5.6 %   HbA1c, POC (prediabetic range)     HbA1c, POC (controlled diabetic range)       Assessment & Plan:  Type 1 diabetes mellitus with other specified complication (HCC) Assessment & Plan: Last A1c 9.3, on recheck today 9.8.  Referral to endocrinology placed at last appointment, they called to schedule but he is waiting until the end of football season.  Until then, increase basal insulin to Tresiba 50 mg daily at bedtime.  Continue checking preprandial blood sugar, postprandial blood sugar, fasting blood sugar.  Adjust NovoLog dosing according to sliding scale.  After 3-4 days if fasting blood glucose still >140, will adjust insulin regimen.  If postprandial blood  glucose remains >180, we will increase to 15 units before smaller meal and 25 units before large meal.  Take Novolog: - 10 units before a smaller meal - 20 units before a larger meal   Add the following Sliding Scale: - 150-175: + 1 unit  - 176-200: + 2 units  - 201-225: + 3 units  - 226-250: + 4 units  - 251-275: + 5 units - 276-300: + 6 units   Goals: - fasting sugar <140 ideally - after meal sugar <180 - HbA1C <7%  Orders: -     POCT glycosylated hemoglobin (Hb A1C) -     NovoLOG FlexPen; INJECT 10 TO 20 UNITS SUBCUTANEOUSLY THREE TIMES DAILY WITH MEALS  Dispense: 30 mL; Refill: 0 -     Tresiba FlexTouch; Inject 45 Units into the skin at bedtime.   Dispense: 45 mL; Refill: 1 -     FreeStyle Lite Test; Test 3 times daily  Dispense: 100 each; Refill: 12  Hypertension associated with diabetes (HCC) -     Losartan Potassium; Take 1.5 tablets (75 mg total) by mouth daily.  Dispense: 135 tablet; Refill: 1    Return in about 3 months (around 12/05/2023) for annual physical, fasting blood work 1 week before.    Melida Quitter, PA

## 2023-09-04 NOTE — Patient Instructions (Signed)
INCREASE Tresiba dose to 50 units daily.  Continue to check your fasting blood sugar. If it is consistently above 180, let me know and we may adjust your Tresiba dose or timing.  Continue checking your blood sugar before meals, take your Novolog injection about 10-15 minutes before eating, and check your blood sugar about 1 hour after eating. If your blood sugar is above 180 an hour after eating, let me know.

## 2023-09-04 NOTE — Assessment & Plan Note (Addendum)
Last A1c 9.3, on recheck today 9.8.  Referral to endocrinology placed at last appointment, they called to schedule but he is waiting until the end of football season.  Until then, increase basal insulin to Tresiba 50 mg daily at bedtime.  Continue checking preprandial blood sugar, postprandial blood sugar, fasting blood sugar.  Adjust NovoLog dosing according to sliding scale.  After 3-4 days if fasting blood glucose still >140, will adjust insulin regimen.  If postprandial blood glucose remains >180, we will increase to 15 units before smaller meal and 25 units before large meal.  Take Novolog: - 10 units before a smaller meal - 20 units before a larger meal   Add the following Sliding Scale: - 150-175: + 1 unit  - 176-200: + 2 units  - 201-225: + 3 units  - 226-250: + 4 units  - 251-275: + 5 units - 276-300: + 6 units   Goals: - fasting sugar <140 ideally - after meal sugar <180 - HbA1C <7%

## 2023-12-01 ENCOUNTER — Other Ambulatory Visit: Payer: Self-pay | Admitting: Family Medicine

## 2023-12-01 DIAGNOSIS — E1069 Type 1 diabetes mellitus with other specified complication: Secondary | ICD-10-CM

## 2023-12-06 ENCOUNTER — Encounter: Payer: Self-pay | Admitting: Family Medicine

## 2023-12-12 ENCOUNTER — Other Ambulatory Visit: Payer: 59

## 2023-12-12 DIAGNOSIS — E039 Hypothyroidism, unspecified: Secondary | ICD-10-CM

## 2023-12-12 DIAGNOSIS — E1069 Type 1 diabetes mellitus with other specified complication: Secondary | ICD-10-CM

## 2023-12-12 DIAGNOSIS — Z1159 Encounter for screening for other viral diseases: Secondary | ICD-10-CM

## 2023-12-12 DIAGNOSIS — I152 Hypertension secondary to endocrine disorders: Secondary | ICD-10-CM

## 2023-12-12 DIAGNOSIS — E559 Vitamin D deficiency, unspecified: Secondary | ICD-10-CM

## 2023-12-12 DIAGNOSIS — E781 Pure hyperglyceridemia: Secondary | ICD-10-CM

## 2023-12-13 ENCOUNTER — Encounter: Payer: Self-pay | Admitting: Family Medicine

## 2023-12-13 LAB — T4F: T4,Free (Direct): 0.96 ng/dL (ref 0.82–1.77)

## 2023-12-13 LAB — CBC WITH DIFFERENTIAL/PLATELET
Basophils Absolute: 0 10*3/uL (ref 0.0–0.2)
Basos: 1 %
EOS (ABSOLUTE): 0.3 10*3/uL (ref 0.0–0.4)
Eos: 5 %
Hematocrit: 46.2 % (ref 37.5–51.0)
Hemoglobin: 15.5 g/dL (ref 13.0–17.7)
Immature Grans (Abs): 0 10*3/uL (ref 0.0–0.1)
Immature Granulocytes: 0 %
Lymphocytes Absolute: 2 10*3/uL (ref 0.7–3.1)
Lymphs: 37 %
MCH: 28.8 pg (ref 26.6–33.0)
MCHC: 33.5 g/dL (ref 31.5–35.7)
MCV: 86 fL (ref 79–97)
Monocytes Absolute: 0.4 10*3/uL (ref 0.1–0.9)
Monocytes: 7 %
Neutrophils Absolute: 2.7 10*3/uL (ref 1.4–7.0)
Neutrophils: 50 %
Platelets: 214 10*3/uL (ref 150–450)
RBC: 5.38 x10E6/uL (ref 4.14–5.80)
RDW: 12.3 % (ref 11.6–15.4)
WBC: 5.4 10*3/uL (ref 3.4–10.8)

## 2023-12-13 LAB — COMPREHENSIVE METABOLIC PANEL
ALT: 27 [IU]/L (ref 0–44)
AST: 25 [IU]/L (ref 0–40)
Albumin: 4.3 g/dL (ref 4.3–5.2)
Alkaline Phosphatase: 90 [IU]/L (ref 44–121)
BUN/Creatinine Ratio: 14 (ref 9–20)
BUN: 14 mg/dL (ref 6–20)
Bilirubin Total: 0.6 mg/dL (ref 0.0–1.2)
CO2: 26 mmol/L (ref 20–29)
Calcium: 9.2 mg/dL (ref 8.7–10.2)
Chloride: 96 mmol/L (ref 96–106)
Creatinine, Ser: 1 mg/dL (ref 0.76–1.27)
Globulin, Total: 2.7 g/dL (ref 1.5–4.5)
Glucose: 256 mg/dL — ABNORMAL HIGH (ref 70–99)
Potassium: 4.4 mmol/L (ref 3.5–5.2)
Sodium: 134 mmol/L (ref 134–144)
Total Protein: 7 g/dL (ref 6.0–8.5)
eGFR: 105 mL/min/{1.73_m2} (ref >59)

## 2023-12-13 LAB — LIPID PANEL
Chol/HDL Ratio: 3.5 {ratio} (ref 0.0–5.0)
Cholesterol, Total: 184 mg/dL (ref 100–199)
HDL: 52 mg/dL (ref >39)
LDL Chol Calc (NIH): 101 mg/dL — ABNORMAL HIGH (ref 0–99)
Triglycerides: 178 mg/dL — ABNORMAL HIGH (ref 0–149)
VLDL Cholesterol Cal: 31 mg/dL (ref 5–40)

## 2023-12-13 LAB — HEPATITIS C ANTIBODY: Hep C Virus Ab: NONREACTIVE

## 2023-12-13 LAB — VITAMIN D 25 HYDROXY (VIT D DEFICIENCY, FRACTURES): Vit D, 25-Hydroxy: 20.9 ng/mL — ABNORMAL LOW (ref 30.0–100.0)

## 2023-12-13 LAB — TSH RFX ON ABNORMAL TO FREE T4: TSH: 5.69 u[IU]/mL — ABNORMAL HIGH (ref 0.450–4.500)

## 2023-12-20 ENCOUNTER — Telehealth (INDEPENDENT_AMBULATORY_CARE_PROVIDER_SITE_OTHER): Payer: 59 | Admitting: Family Medicine

## 2023-12-20 ENCOUNTER — Encounter: Payer: Self-pay | Admitting: Family Medicine

## 2023-12-20 VITALS — BP 136/81

## 2023-12-20 DIAGNOSIS — R946 Abnormal results of thyroid function studies: Secondary | ICD-10-CM

## 2023-12-20 DIAGNOSIS — E781 Pure hyperglyceridemia: Secondary | ICD-10-CM

## 2023-12-20 DIAGNOSIS — E1069 Type 1 diabetes mellitus with other specified complication: Secondary | ICD-10-CM

## 2023-12-20 DIAGNOSIS — R7989 Other specified abnormal findings of blood chemistry: Secondary | ICD-10-CM

## 2023-12-20 DIAGNOSIS — E1159 Type 2 diabetes mellitus with other circulatory complications: Secondary | ICD-10-CM | POA: Diagnosis not present

## 2023-12-20 DIAGNOSIS — I152 Hypertension secondary to endocrine disorders: Secondary | ICD-10-CM

## 2023-12-20 DIAGNOSIS — E1169 Type 2 diabetes mellitus with other specified complication: Secondary | ICD-10-CM

## 2023-12-20 DIAGNOSIS — Z794 Long term (current) use of insulin: Secondary | ICD-10-CM

## 2023-12-20 DIAGNOSIS — E559 Vitamin D deficiency, unspecified: Secondary | ICD-10-CM

## 2023-12-20 MED ORDER — LOSARTAN POTASSIUM 50 MG PO TABS: 75.0000 mg | ORAL_TABLET | Freq: Every day | ORAL | 1 refills | Status: DC

## 2023-12-20 MED ORDER — VITAMIN D (ERGOCALCIFEROL) 1.25 MG (50000 UNIT) PO CAPS: 50000.0000 [IU] | ORAL_CAPSULE | ORAL | 0 refills | Status: DC

## 2023-12-20 MED ORDER — FREESTYLE LITE TEST VI STRP: ORAL_STRIP | 12 refills | Status: AC

## 2023-12-20 MED ORDER — NOVOLOG FLEXPEN 100 UNIT/ML ~~LOC~~ SOPN: PEN_INJECTOR | SUBCUTANEOUS | 0 refills | Status: DC

## 2023-12-20 MED ORDER — TRESIBA FLEXTOUCH 100 UNIT/ML ~~LOC~~ SOPN: 45.0000 [IU] | PEN_INJECTOR | Freq: Every day | SUBCUTANEOUS | 1 refills | Status: DC

## 2023-12-20 NOTE — Assessment & Plan Note (Signed)
TSH elevated 5.690 with normal T4. Discussed importance of good glycemin control in order to prevent abnormal thyroid levels. Will continue to monitor.

## 2023-12-20 NOTE — Assessment & Plan Note (Signed)
Start vitamin D prescription for 3 months, then change to OTC vitamin D3 2000 unit daily supplement to maintain vitamin D levels.

## 2023-12-20 NOTE — Progress Notes (Signed)
Virtual Visit via Video Note  I connected with [name] on 12/20/23 at  8:30 AM EST by a video enabled telemedicine application and verified that I am speaking with the correct person using two identifiers.  Patient Location: Home Provider Location: Office/clinic  I discussed the limitations, risks, security, and privacy concerns of performing an evaluation and management service by video and the availability of in person appointments. I also discussed with the patient that there may be a patient responsible charge related to this service. The patient expressed understanding and agreed to proceed.    Subjective   Patient ID: Cristian Kennedy, male    DOB: Jul 30, 1996  Age: 28 y.o. MRN: [id number]  No chief complaint on file.   HPI Cristian Kennedy is a 28 y.o. male presenting today for follow up of hypertension, hyperlipidemia, diabetes. Hypertension: Patient here for follow-up of elevated blood pressure. He is exercising   Pt denies chest pain, SOB, dizziness, edema, syncope, fatigue or heart palpitations. Taking losartan, reports excellent compliance with treatment. Denies side effects. Hyperlipidemia: Currently managing with nutrition and physical activity. The ASCVD Risk score (Arnett DK, et al., 2019) failed to calculate for the following reasons:   The 2019 ASCVD risk score is only valid for ages 87 to 69 Diabetes: denies hypoglycemic events, wounds or sores that are not healing well, increased thirst or urination. Denies vision problems, eye exam up to date. Taking insulin as prescribed without any side effects. Fasting blood sugars around 140-170.  ROS Negative unless otherwise noted in HPI  Outpatient Medications Prior to Visit  Medication Sig   [DISCONTINUED] Cholecalciferol (VITAMIN D3) 5000 units TABS 5,000 IU OTC vitamin D3 daily.   [DISCONTINUED] glucose blood (FREESTYLE LITE) test strip Test 3 times daily   [DISCONTINUED] insulin degludec (TRESIBA FLEXTOUCH) 100  UNIT/ML FlexTouch Pen Inject 45 Units into the skin at bedtime.   [DISCONTINUED] losartan (COZAAR) 50 MG tablet Take 1.5 tablets (75 mg total) by mouth daily.   [DISCONTINUED] NOVOLOG FLEXPEN 100 UNIT/ML FlexPen INJECT 10 TO 20 UNITS SUBCUTANEOUSLY THREE TIMES DAILY WITH MEALS   No facility-administered medications prior to visit.     Objective:     Physical Exam General: Speaking clearly in complete sentences without any shortness of breath.  Alert and oriented x3.  Normal judgment. No apparent acute distress.   Assessment & Plan:  Hypertension associated with diabetes (HCC) Assessment & Plan: BP goal <130/80.  Close to goal, continue ambulatory blood pressure monitoring and let PCP know if blood pressures are consistently much higher than goal.  Continue losartan 75 mg daily.  Will continue to monitor.  Orders: -     Losartan Potassium; Take 1.5 tablets (75 mg total) by mouth daily.  Dispense: 135 tablet; Refill: 1  Type 1 diabetes mellitus with other specified complication (HCC) Assessment & Plan: Last A1C 9.8. He is planning to see endocrinology at the end of March/early April. Increase in insulin has resulted in improved fasting blood sugars. Continue basal insulin Tresiba 50 mg daily at bedtime and NovoLog sliding scale until appointment with endocrinology. Continue checking preprandial blood sugar, postprandial blood sugar, fasting blood sugar.   Take Novolog: - 10 units before a smaller meal - 20 units before a larger meal    Add the following Sliding Scale: - 150-175: + 1 unit  - 176-200: + 2 units  - 201-225: + 3 units  - 226-250: + 4 units  - 251-275: + 5 units - 276-300: +  6 units    Goals: - fasting sugar <140 ideally - after meal sugar <180 - HbA1C <7%  Orders: -     FreeStyle Lite Test; Test 3 times daily  Dispense: 100 each; Refill: 12 -     Tresiba FlexTouch; Inject 45 Units into the skin at bedtime.  Dispense: 45 mL; Refill: 1 -     NovoLOG FlexPen;  INJECT 10 TO 20 UNITS SUBCUTANEOUSLY THREE TIMES DAILY WITH MEALS  Dispense: 30 mL; Refill: 0  Vitamin D insufficiency Assessment & Plan: Start vitamin D prescription for 3 months, then change to OTC vitamin D3 2000 unit daily supplement to maintain vitamin D levels.  Orders: -     Vitamin D (Ergocalciferol); Take 1 capsule (50,000 Units total) by mouth every 7 (seven) days.  Dispense: 12 capsule; Refill: 0  Hypertriglyceridemia, familial Assessment & Plan: Last lipid panel: LDL 101, triglycerides 178.  Continue with low-carb diet and exercise.  Will continue to monitor.   Elevated TSH Assessment & Plan: TSH elevated 5.690 with normal T4. Discussed importance of good glycemin control in order to prevent abnormal thyroid levels. Will continue to monitor.     Return in about 4 months (around 04/18/2024) for follow-up for HTN, HLD.   I discussed the assessment and treatment plan with the patient. The patient was provided an opportunity to ask questions, and all were answered. The patient agreed with the plan and demonstrated an understanding of the instructions.   The patient was advised to call back or seek an in-person evaluation if the symptoms worsen or if the condition fails to improve as anticipated.  The above assessment and management plan was discussed with the patient. The patient verbalized understanding of and has agreed to the management plan.   Cristian Quitter, PA

## 2023-12-20 NOTE — Assessment & Plan Note (Signed)
BP goal <130/80.  Close to goal, continue ambulatory blood pressure monitoring and let PCP know if blood pressures are consistently much higher than goal.  Continue losartan 75 mg daily.  Will continue to monitor.

## 2023-12-20 NOTE — Assessment & Plan Note (Signed)
Last A1C 9.8. He is planning to see endocrinology at the end of March/early April. Increase in insulin has resulted in improved fasting blood sugars. Continue basal insulin Tresiba 50 mg daily at bedtime and NovoLog sliding scale until appointment with endocrinology. Continue checking preprandial blood sugar, postprandial blood sugar, fasting blood sugar.   Take Novolog: - 10 units before a smaller meal - 20 units before a larger meal    Add the following Sliding Scale: - 150-175: + 1 unit  - 176-200: + 2 units  - 201-225: + 3 units  - 226-250: + 4 units  - 251-275: + 5 units - 276-300: + 6 units    Goals: - fasting sugar <140 ideally - after meal sugar <180 - HbA1C <7%

## 2023-12-20 NOTE — Assessment & Plan Note (Signed)
Last lipid panel: LDL 101, triglycerides 178.  Continue with low-carb diet and exercise.  Will continue to monitor.

## 2024-03-04 ENCOUNTER — Other Ambulatory Visit: Payer: Self-pay | Admitting: Family Medicine

## 2024-03-04 DIAGNOSIS — E1069 Type 1 diabetes mellitus with other specified complication: Secondary | ICD-10-CM

## 2024-03-16 ENCOUNTER — Other Ambulatory Visit: Payer: Self-pay | Admitting: Family Medicine

## 2024-03-16 DIAGNOSIS — E559 Vitamin D deficiency, unspecified: Secondary | ICD-10-CM

## 2024-04-18 ENCOUNTER — Ambulatory Visit

## 2024-04-18 VITALS — BP 112/70 | HR 80 | Ht 75.0 in | Wt 296.8 lb

## 2024-04-18 DIAGNOSIS — E559 Vitamin D deficiency, unspecified: Secondary | ICD-10-CM

## 2024-04-18 DIAGNOSIS — I152 Hypertension secondary to endocrine disorders: Secondary | ICD-10-CM | POA: Diagnosis not present

## 2024-04-18 DIAGNOSIS — E1069 Type 1 diabetes mellitus with other specified complication: Secondary | ICD-10-CM

## 2024-04-18 DIAGNOSIS — E1159 Type 2 diabetes mellitus with other circulatory complications: Secondary | ICD-10-CM

## 2024-04-18 DIAGNOSIS — R7989 Other specified abnormal findings of blood chemistry: Secondary | ICD-10-CM

## 2024-04-18 DIAGNOSIS — E781 Pure hyperglyceridemia: Secondary | ICD-10-CM

## 2024-04-18 DIAGNOSIS — Z794 Long term (current) use of insulin: Secondary | ICD-10-CM

## 2024-04-18 DIAGNOSIS — E039 Hypothyroidism, unspecified: Secondary | ICD-10-CM

## 2024-04-18 NOTE — Assessment & Plan Note (Signed)
 Continue Tresiba  50 mg daily at bedtime and NovoLog  sliding scale until appointment with endocrinology.  Continue checking preprandial blood sugar, postprandial blood sugar, fasting blood sugar.  Referral to endocrinology placed today.  Patient also provided with endocrinology offices phone number to call if he does not receive a call from them in the coming days.  Updating UACR and A1c today.  Add the following Sliding Scale: - 150-175: + 1 unit  - 176-200: + 2 units  - 201-225: + 3 units  - 226-250: + 4 units  - 251-275: + 5 units - 276-300: + 6 units    Goals: - fasting sugar <140 ideally - after meal sugar <180 - HbA1C <7%

## 2024-04-18 NOTE — Progress Notes (Signed)
 Established Patient Office Visit  Subjective   Patient ID: NOHLAN BURDIN, male    DOB: [birth year]  Age: 28 y.o. MRN: [id number]  Chief Complaint  Patient presents with   Medical Management of Chronic Issues    HPI  BAKARI NIKOLAI is a 28 y.o. y/o male who presents to the clinic today for follow up on hypertension, type 1 diabetes, hypothyroid, hyperlipidemia.   Hypertension: Patient currently taking 75 mg of losartan  daily for his blood pressure.  Reports excellent compliance with this medication.  Denies side effects or episodes of hypotension.  Patient participates in ambulatory blood pressure monitoring and reports that all readings at home are less than 130/80.  Type 1 diabetes: Denies hypoglycemic events, wounds or sores are not healing well, increased thirst or urination.  Denies vision problems, eye exam up-to-date.  Taking insulin  as prescribed without side effects.  Fasting blood sugars around 140-170.  At his last visit, Britta Candy placed a referral to endocrinology.  Endocrinology placed a note that they attempted to call the patient 3 times with no answer.  Today patient reports that he thinks he [facility] might have been declined due to unknown number being recognized as spam.  Hyperlipidemia: Patient has a history of hyperlipidemia not currently on lipid-lowering medications.  He is currently managing with nutrition and physical activity.  Does report that over the last several months he has gained some weight, however has been working to lose it.  Reports that he has been doing more cardio and has lost 8 to 9 pounds.    ROS Per HPI.    Objective:     BP 112/70   Pulse 80   Ht 6' 3 (1.905 m)   Wt 296 lb 12 oz (134.6 kg)   SpO2 100%   BMI 37.09 kg/m    Physical Exam Constitutional:      General: He is not in acute distress.    Appearance: Normal appearance.   Cardiovascular:     Rate and Rhythm: Normal rate and regular rhythm.     Heart sounds: Normal heart  sounds. No murmur heard.    No friction rub. No gallop.  Pulmonary:     Effort: Pulmonary effort is normal. No respiratory distress.     Breath sounds: Normal breath sounds.   Musculoskeletal:        General: No swelling.   Skin:    General: Skin is warm and dry.   Neurological:     General: No focal deficit present.     Mental Status: He is alert.   Psychiatric:        Mood and Affect: Mood normal.        Behavior: Behavior normal.        Thought Content: Thought content normal.      No results found for any visits on 04/18/24.    The ASCVD Risk score (Arnett DK, et al., 2019) failed to calculate for the following reasons:   The 2019 ASCVD risk score is only valid for ages 14 to 22    Assessment & Plan:   Type 1 diabetes mellitus with other specified complication (HCC) Assessment & Plan: Continue Tresiba  50 mg daily at bedtime and NovoLog  sliding scale until appointment with endocrinology.  Continue checking preprandial blood sugar, postprandial blood sugar, fasting blood sugar.  Referral to endocrinology placed today.  Patient also provided with endocrinology offices phone number to call if he does not receive a call from them in the  coming days.  Updating UACR and A1c today.  Add the following Sliding Scale: - 150-175: + 1 unit  - 176-200: + 2 units  - 201-225: + 3 units  - 226-250: + 4 units  - 251-275: + 5 units - 276-300: + 6 units    Goals: - fasting sugar <140 ideally - after meal sugar <180 - HbA1C <7%  Orders: -     CBC with Differential/Platelet -     Comprehensive metabolic panel with GFR -     Hemoglobin A1c -     Ambulatory referral to Endocrinology -     Microalbumin / creatinine urine ratio  Hypertension associated with diabetes (HCC) Assessment & Plan: Goal <130/80.  Blood pressure above goal on initial check, at goal on recheck.  Encouraged him to continue ambulatory blood pressure monitoring.  Continue losartan  75 mg daily.  Will continue  to monitor.  Orders: -     CBC with Differential/Platelet -     Comprehensive metabolic panel with GFR  Vitamin D  insufficiency Assessment & Plan: Continue vitamin D  50,000 IU once weekly.  Rechecking vitamin D  level today.  Counseled patient that after he finishes his weekly doses to transition to an over-the-counter vitamin D3 2000 IU daily supplement to maintain his vitamin D  levels.  Orders: -     VITAMIN D  25 Hydroxy (Vit-D Deficiency, Fractures)  Elevated TSH -     TSH  Hypothyroidism, unspecified type Assessment & Plan: Rechecking TSH today.  Orders: -     TSH  Hypertriglyceridemia, familial Assessment & Plan: Updating lipid panel today.  Last lipid panel from February showed LDL 101, triglycerides 178.  Discussed with patient that given his risk factors including type 1 diabetes, it is recommended that if his lipid panel is still elevated that he should start on statin therapy.  Patient verbalized understanding and was in agreement with this plan.  Orders: -     Lipid panel    Return in about 7 weeks (around 06/06/2024) for HTN, HLD, DM.    Odilia Bennett, PA-C

## 2024-04-18 NOTE — Patient Instructions (Addendum)
 It was nice to see you today!  As we discussed in clinic:  -Continue all of your daily medications as prescribed.  -Monitor blood pressure readings at home and notify us  if readings are consistently >130/80. Ensure that you are sitting down with feet flat on the floor with arm supported when checking BP.  -I have placed a new referral to endocrinology for closer management of your type 1 diabetes. If they do not call you within 1 week, their phone number is: [phone] -We are checking labs again today. I will send you a MyChart message in the coming days explaining the results. If your cholesterol is still elevated, I will send in 5 mg of rosuvastatin to take daily to lower the cholesterol as we discussed.  -Let's plan to meet again in August before you go back to school to discuss blood work and reassess blood pressure.   It was nice to meet you!  If you have any problems before your next visit feel free to message me via MyChart (minor issues or questions) or call the office, otherwise you may reach out to schedule an office visit.  Thank you! Meryl Acosta, PA-C

## 2024-04-18 NOTE — Assessment & Plan Note (Signed)
 Continue vitamin D  50,000 IU once weekly.  Rechecking vitamin D  level today.  Counseled patient that after he finishes his weekly doses to transition to an over-the-counter vitamin D3 2000 IU daily supplement to maintain his vitamin D  levels.

## 2024-04-18 NOTE — Assessment & Plan Note (Signed)
 Updating lipid panel today.  Last lipid panel from February showed LDL 101, triglycerides 178.  Discussed with patient that given his risk factors including type 1 diabetes, it is recommended that if his lipid panel is still elevated that he should start on statin therapy.  Patient verbalized understanding and was in agreement with this plan.

## 2024-04-18 NOTE — Assessment & Plan Note (Signed)
Rechecking TSH today

## 2024-04-18 NOTE — Assessment & Plan Note (Signed)
 Goal <130/80.  Blood pressure above goal on initial check, at goal on recheck.  Encouraged him to continue ambulatory blood pressure monitoring.  Continue losartan  75 mg daily.  Will continue to monitor.

## 2024-04-19 LAB — MICROALBUMIN / CREATININE URINE RATIO
Creatinine, Urine: 159.3 mg/dL
Microalb/Creat Ratio: <2 mg/g{creat} (ref 0–29)
Microalbumin, Urine: <3 ug/mL

## 2024-04-19 LAB — COMPREHENSIVE METABOLIC PANEL WITH GFR
ALT: 21 IU/L (ref 0–44)
AST: 23 IU/L (ref 0–40)
Albumin: 4.5 g/dL (ref 4.3–5.2)
Alkaline Phosphatase: 75 IU/L (ref 44–121)
BUN/Creatinine Ratio: 11 (ref 9–20)
BUN: 10 mg/dL (ref 6–20)
Bilirubin Total: 0.5 mg/dL (ref 0.0–1.2)
CO2: 22 mmol/L (ref 20–29)
Calcium: 9.4 mg/dL (ref 8.7–10.2)
Chloride: 103 mmol/L (ref 96–106)
Creatinine, Ser: 0.93 mg/dL (ref 0.76–1.27)
Globulin, Total: 2.5 g/dL (ref 1.5–4.5)
Glucose: 78 mg/dL (ref 70–99)
Potassium: 4.1 mmol/L (ref 3.5–5.2)
Sodium: 139 mmol/L (ref 134–144)
Total Protein: 7 g/dL (ref 6.0–8.5)
eGFR: 114 mL/min/{1.73_m2} (ref >59)

## 2024-04-19 LAB — LIPID PANEL
Chol/HDL Ratio: 3 ratio (ref 0.0–5.0)
Cholesterol, Total: 127 mg/dL (ref 100–199)
HDL: 43 mg/dL (ref >39)
LDL Chol Calc (NIH): 63 mg/dL (ref 0–99)
Triglycerides: 119 mg/dL (ref 0–149)
VLDL Cholesterol Cal: 21 mg/dL (ref 5–40)

## 2024-04-19 LAB — CBC WITH DIFFERENTIAL/PLATELET
Basophils Absolute: 0 10*3/uL (ref 0.0–0.2)
Basos: 0 %
EOS (ABSOLUTE): 0.2 10*3/uL (ref 0.0–0.4)
Eos: 2 %
Hematocrit: 49.9 % (ref 37.5–51.0)
Hemoglobin: 16.2 g/dL (ref 13.0–17.7)
Immature Grans (Abs): 0 10*3/uL (ref 0.0–0.1)
Immature Granulocytes: 0 %
Lymphocytes Absolute: 1.5 10*3/uL (ref 0.7–3.1)
Lymphs: 23 %
MCH: 28.5 pg (ref 26.6–33.0)
MCHC: 32.5 g/dL (ref 31.5–35.7)
MCV: 88 fL (ref 79–97)
Monocytes Absolute: 0.4 10*3/uL (ref 0.1–0.9)
Monocytes: 7 %
Neutrophils Absolute: 4.4 10*3/uL (ref 1.4–7.0)
Neutrophils: 68 %
Platelets: 222 10*3/uL (ref 150–450)
RBC: 5.68 x10E6/uL (ref 4.14–5.80)
RDW: 13.2 % (ref 11.6–15.4)
WBC: 6.4 10*3/uL (ref 3.4–10.8)

## 2024-04-19 LAB — HEMOGLOBIN A1C
Est. average glucose Bld gHb Est-mCnc: 212 mg/dL
Hgb A1c MFr Bld: 9 % — ABNORMAL HIGH (ref 4.8–5.6)

## 2024-04-19 LAB — VITAMIN D 25 HYDROXY (VIT D DEFICIENCY, FRACTURES): Vit D, 25-Hydroxy: 41.2 ng/mL (ref 30.0–100.0)

## 2024-04-19 LAB — TSH: TSH: 3.13 u[IU]/mL (ref 0.450–4.500)

## 2024-04-20 ENCOUNTER — Other Ambulatory Visit: Payer: Self-pay | Admitting: Family Medicine

## 2024-04-20 DIAGNOSIS — E1069 Type 1 diabetes mellitus with other specified complication: Secondary | ICD-10-CM

## 2024-04-21 ENCOUNTER — Ambulatory Visit: Payer: Self-pay

## 2024-05-21 ENCOUNTER — Telehealth: Payer: Self-pay

## 2024-05-21 NOTE — Telephone Encounter (Signed)
 I advised pt forms are ready for pick up.

## 2024-05-21 NOTE — Telephone Encounter (Signed)
 Pt dropped off his new Insulin -treated DM assessment form along with the old one that was completed previously as a reference. Pt can be reached via Mychart if you have questions. I placed in the provider box

## 2024-05-23 ENCOUNTER — Other Ambulatory Visit: Payer: Self-pay

## 2024-05-23 NOTE — Progress Notes (Signed)
 Opened in error

## 2024-05-23 NOTE — Progress Notes (Deleted)
 Opened in error

## 2024-06-06 ENCOUNTER — Other Ambulatory Visit: Payer: Self-pay | Admitting: Family Medicine

## 2024-06-06 DIAGNOSIS — E559 Vitamin D deficiency, unspecified: Secondary | ICD-10-CM

## 2024-06-08 ENCOUNTER — Other Ambulatory Visit: Payer: Self-pay

## 2024-06-08 DIAGNOSIS — E1069 Type 1 diabetes mellitus with other specified complication: Secondary | ICD-10-CM

## 2024-06-13 ENCOUNTER — Ambulatory Visit

## 2024-06-13 VITALS — BP 125/79 | HR 65 | Temp 97.4°F | Ht 75.0 in | Wt 296.1 lb

## 2024-06-13 DIAGNOSIS — I152 Hypertension secondary to endocrine disorders: Secondary | ICD-10-CM

## 2024-06-13 DIAGNOSIS — E1069 Type 1 diabetes mellitus with other specified complication: Secondary | ICD-10-CM | POA: Diagnosis not present

## 2024-06-13 DIAGNOSIS — E781 Pure hyperglyceridemia: Secondary | ICD-10-CM | POA: Diagnosis not present

## 2024-06-13 DIAGNOSIS — L720 Epidermal cyst: Secondary | ICD-10-CM | POA: Diagnosis not present

## 2024-06-13 DIAGNOSIS — E1159 Type 2 diabetes mellitus with other circulatory complications: Secondary | ICD-10-CM

## 2024-06-13 NOTE — Assessment & Plan Note (Signed)
 BP goal <130/80. Well-controlled with losartan  50 mg. Home readings within target range. Discussed increasing losartan  if readings exceed 130/80. - Continue losartan  50 mg daily. - Monitor blood pressure at home. - Increase losartan  to 75 mg if home readings consistently exceed 130/80.

## 2024-06-13 NOTE — Assessment & Plan Note (Signed)
 A1c improved to 9 from 10.7. Lipid panel improved, LDL reduced to 63. No continuous glucose monitor due to activity concerns. Cholesterol medication not indicated. - Continue Tresiba  50 units daily. - Continue Novolog  with meals as needed. - Refer to endocrinology for further management. - Monitor A1c and lipid panel in November. UACR, eye exam, and foot exam UTD.  - Phone number provided to call endocrinology office to set up an appointment    Add the following Sliding Scale: - 150-175: + 1 unit  - 176-200: + 2 units  - 201-225: + 3 units  - 226-250: + 4 units  - 251-275: + 5 units - 276-300: + 6 units    Goals: - fasting sugar <140 ideally - after meal sugar <180 - HbA1C <7%

## 2024-06-13 NOTE — Assessment & Plan Note (Signed)
 Last lipid panel: LDL 63, HDL 43, Trig 119. The ASCVD Risk score (Arnett DK, et al., 2019) failed to calculate for the following reasons:   The 2019 ASCVD risk score is only valid for ages 35 to 23 LDL much improved from previous. Continue managing with diet and exercise. Discussed that if LDL becomes elevated again in the future, I highly recommend starting statin therapy due to his hx of T1DM.

## 2024-06-13 NOTE — Assessment & Plan Note (Signed)
 Non-tender, stable cyst present since age 28. Discussed excision for cosmetic reasons. - Schedule excision of epidermal cyst on November 11th.

## 2024-06-13 NOTE — Patient Instructions (Addendum)
 VISIT SUMMARY: Today, you had a follow-up appointment to review your diabetes management, blood pressure, cholesterol levels, and a cyst on your back. We discussed your current medications, recent improvements in your health, and plans for ongoing management.  YOUR PLAN: -TYPE 1 DIABETES MELLITUS: Type 1 diabetes is a condition where your body does not produce insulin , requiring you to take insulin  to manage your blood sugar levels. Your A1c has improved to 9 from 10.7, and your LDL cholesterol has decreased to 63. Continue taking Tresiba  50 units daily and Novolog  with meals as needed. You will be referred to an endocrinologist for further management, and we will monitor your A1c and lipid panel again in November. Please call them to set up an appointment:  [phone]   -ESSENTIAL HYPERTENSION: Hypertension, or high blood pressure, is well-controlled with your current medication, losartan  50 mg daily. Your home blood pressure readings are within the target range. Continue taking losartan  50 mg daily and monitor your blood pressure at home. If your readings consistently exceed 130/80, we will increase your losartan  dose to 75 mg.  -EPIDERMAL CYST OF BACK: An epidermal cyst is a non-cancerous lump beneath the skin. The cyst on your back has been stable and non-tender. We discussed removing it for cosmetic reasons, and you are scheduled for an excision on November 11th.  INSTRUCTIONS: You are scheduled for an excision of the cyst on your back on November 11th. Please monitor your blood pressure at home and keep track of your readings. We will check your A1c and lipid panel again in November. You will also be referred to an endocrinologist for further management of your diabetes.  If you have any problems before your next visit feel free to message me via MyChart (minor issues or questions) or call the office, otherwise you may reach out to schedule an office visit.  Thank you! Saddie Sacks, PA-C

## 2024-06-13 NOTE — Progress Notes (Signed)
 Established Patient Office Visit  Subjective   Patient ID: Cristian Kennedy, male    DOB: [birth year]  Age: 28 y.o. MRN: [id number]  Chief Complaint  Patient presents with   Medical Management of Chronic Issues    HPI History of Present Illness   Cristian Kennedy is a 28 year old male with type 1 diabetes who presents for follow-up of his T1DM, HTN, HLD.  Glycemic control in type 1 diabetes - Type 1 diabetes managed with Tresiba  50 units daily (long-acting insulin ) and Novolog  SSI as needed with meals - Last hemoglobin A1c was 9, improved from 10.7 one year ago - Improvement attributed to increased physical activity during the summer - Not using a continuous glucose monitor due to concerns about device damage during football season  - Referral placed twice in the last 2 years for endocrinology, however patient reports they never called him. Received message from [facility] office several weeks ago that they had tried to call the patient 3 times with inability to get in contact with him.   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    Objective:     BP 125/79   Pulse 65   Temp (!) 97.4 F (36.3 C) (Oral)   Ht 6' 3 (1.905 m)   Wt 296 lb 1.9 oz (134.3 kg)   SpO2 99%   BMI 37.01 kg/m     Physical Exam Constitutional:      General: He is not in acute distress.    Appearance: Normal appearance.  Cardiovascular:     Rate and Rhythm: Normal rate and regular rhythm.     Heart sounds: Normal heart sounds. No murmur heard.    No friction rub. No gallop.  Pulmonary:     Effort: Pulmonary effort is normal. No respiratory distress.     Breath sounds: Normal breath sounds.  Musculoskeletal:        General: No swelling.  Skin:    General: Skin is warm and dry.     Comments: Approximately dime sized homogenous, firm epidermoid cyst located on upper back between shoulder blades. There is no discharge, overlying erythema, warmth, or discharge.   Neurological:     General: No focal deficit present.     Mental Status: He is alert.  Psychiatric:        Mood and Affect: Mood normal.        Behavior: Behavior normal.        Thought Content: Thought content normal.    No results found for any visits on 06/13/24.  Last CBC Lab Results  Component Value Date   WBC 6.4 04/18/2024   HGB 16.2 04/18/2024   HCT 49.9 04/18/2024   MCV 88 04/18/2024   MCH 28.5 04/18/2024   RDW 13.2 04/18/2024   PLT 222  04/18/2024   Last metabolic panel Lab Results  Component Value Date   GLUCOSE 78 04/18/2024   NA 139 04/18/2024   K 4.1 04/18/2024   CL 103 04/18/2024   CO2 22 04/18/2024   BUN 10 04/18/2024   CREATININE 0.93 04/18/2024   EGFR 114 04/18/2024   CALCIUM 9.4 04/18/2024   PROT 7.0 04/18/2024   ALBUMIN 4.5 04/18/2024   LABGLOB 2.5 04/18/2024   AGRATIO 2.0 03/22/2023   BILITOT 0.5 04/18/2024   ALKPHOS 75 04/18/2024   AST 23 04/18/2024   ALT 21 04/18/2024   Last lipids Lab Results  Component Value Date   CHOL 127 04/18/2024   HDL 43 04/18/2024   LDLCALC 63 04/18/2024   TRIG 119 04/18/2024   CHOLHDL 3.0 04/18/2024   Last hemoglobin A1c Lab Results  Component Value Date   HGBA1C 9.0 (H) 04/18/2024   Last thyroid functions Lab Results  Component Value Date   TSH  3.130 04/18/2024   T3TOTAL 137 06/17/2019   Last vitamin D  Lab Results  Component Value Date   VD25OH 41.2 04/18/2024      The ASCVD Risk score (Arnett DK, et al., 2019) failed to calculate for the following reasons:   The 2019 ASCVD risk score is only valid for ages 54 to 32    Assessment & Plan:   Hypertension associated with diabetes (HCC) Assessment & Plan: BP goal <130/80. Well-controlled with losartan  50 mg. Home readings within target range. Discussed increasing losartan  if readings exceed 130/80. - Continue losartan  50 mg daily. - Monitor blood pressure at home. - Increase losartan  to 75 mg if home readings consistently exceed 130/80.   Type 1 diabetes mellitus with other specified complication (HCC) Assessment & Plan: A1c improved to 9 from 10.7. Lipid panel improved, LDL reduced to 63. No continuous glucose monitor due to activity concerns. Cholesterol medication not indicated. - Continue Tresiba  50 units daily. - Continue Novolog  with meals as needed. - Refer to endocrinology for further management. - Monitor A1c and lipid panel in November. UACR, eye exam, and foot exam UTD.  - Phone number provided to call endocrinology office to set up an appointment    Add the following Sliding Scale: - 150-175: + 1 unit  - 176-200: + 2 units  - 201-225: + 3 units  - 226-250: + 4 units  - 251-275: + 5 units - 276-300: + 6 units    Goals: - fasting sugar <140 ideally - after meal sugar <180 - HbA1C <7%    Hypertriglyceridemia, familial Assessment & Plan: Last lipid panel: LDL 63, HDL 43, Trig 119. The ASCVD Risk score (Arnett DK, et al., 2019) failed to calculate for the following reasons:   The 2019 ASCVD risk score is only valid for ages 30 to 24 LDL much improved from previous. Continue managing with diet and exercise. Discussed that if LDL becomes elevated again in the future, I highly recommend starting statin therapy due to his hx of T1DM.   Epidermoid cyst  of skin of back Assessment & Plan: Non-tender, stable cyst present since age 65. Discussed excision for cosmetic reasons. - Schedule excision of epidermal cyst on November 11th.         Return in about 3 months (around 09/09/2024) for HTN, HLD, DM, cyst removal.    Saddie JULIANNA Sacks, PA-C

## 2024-07-25 ENCOUNTER — Other Ambulatory Visit: Payer: Self-pay

## 2024-07-25 DIAGNOSIS — E1069 Type 1 diabetes mellitus with other specified complication: Secondary | ICD-10-CM

## 2024-09-09 ENCOUNTER — Ambulatory Visit (INDEPENDENT_AMBULATORY_CARE_PROVIDER_SITE_OTHER)

## 2024-09-09 VITALS — BP 118/79 | HR 60 | Temp 98.0°F | Ht 75.0 in | Wt 281.0 lb

## 2024-09-09 DIAGNOSIS — L720 Epidermal cyst: Secondary | ICD-10-CM

## 2024-09-09 DIAGNOSIS — R7989 Other specified abnormal findings of blood chemistry: Secondary | ICD-10-CM

## 2024-09-09 DIAGNOSIS — E039 Hypothyroidism, unspecified: Secondary | ICD-10-CM

## 2024-09-09 DIAGNOSIS — I152 Hypertension secondary to endocrine disorders: Secondary | ICD-10-CM

## 2024-09-09 DIAGNOSIS — E1069 Type 1 diabetes mellitus with other specified complication: Secondary | ICD-10-CM | POA: Diagnosis not present

## 2024-09-09 DIAGNOSIS — E781 Pure hyperglyceridemia: Secondary | ICD-10-CM

## 2024-09-09 DIAGNOSIS — E559 Vitamin D deficiency, unspecified: Secondary | ICD-10-CM | POA: Diagnosis not present

## 2024-09-09 LAB — POCT GLYCOSYLATED HEMOGLOBIN (HGB A1C): Hemoglobin A1C: 10.7 % — AB (ref 4.0–5.6)

## 2024-09-09 LAB — POCT UA - MICROALBUMIN
Albumin/Creatinine Ratio, Urine, POC: <30
Creatinine, POC: 300 mg/dL
Microalbumin Ur, POC: 30 mg/L

## 2024-09-09 NOTE — Assessment & Plan Note (Signed)
 Last lipid panel: LDL 63, HDL 43, Trig 119. The ASCVD Risk score (Arnett DK, et al., 2019) failed to calculate for the following reasons:   The 2019 ASCVD risk score is only valid for ages 58 to 67 Rechecking lipid panel today with labs. Patient is down about 15 lbs. Discussed that if LDL becomes elevated again in the future, I highly recommend starting statin therapy due to his hx of T1DM.

## 2024-09-09 NOTE — Patient Instructions (Addendum)
 VISIT SUMMARY: During your visit, we discussed your type 1 diabetes management, hypertension, hyperlipidemia, and vitamin D  supplementation.   YOUR PLAN: TYPE 1 DIABETES MELLITUS WITH POOR GLYCEMIC CONTROL: Your A1c has increased to 10.7%, and you have experienced weight loss and irregular blood sugar levels due to dietary habits and physical activity during football season. -Increase Tresiba  to 55 units at bedtime. -Adjust Tresiba  dose based on fasting blood sugars, aiming for 140-150 mg/dL. -Call endocrinology daily to schedule an appointment. Their phone number is [phone].  -Follow-up in 3 months to reassess glycemic control.  HYPERTENSION: Your blood pressure is well-controlled with your current medication. -Continue taking losartan  1 tablet daily.  HYPERLIPIDEMIA: Your cholesterol levels were normal in June, and we will re-evaluate them today.   VITAMIN D  DEFICIENCY: You are currently on a high-dose vitamin D  supplement. -Continue current high-dose vitamin D  regimen until completion. -Transition to daily over-the-counter vitamin D  supplementation after completion of current regimen.  GENERAL HEALTH MAINTENANCE: Routine health maintenance was discussed, including the need for regular eye exams and diabetic kidney screening. -Obtain recent eye exam results from Dr. Ladora.  If you have any problems before your next visit feel free to message me via MyChart (minor issues or questions) or call the office, otherwise you may reach out to schedule an office visit.  Thank you! Saddie Sacks, PA-C

## 2024-09-09 NOTE — Assessment & Plan Note (Signed)
 BP goal <130/80. Well-controlled with losartan  50 mg. Home readings within target range. Discussed increasing losartan  if readings exceed 130/80. - Continue losartan  50 mg daily. - Monitor blood pressure at home.

## 2024-09-09 NOTE — Assessment & Plan Note (Addendum)
 A1c increased to 10.7 from 9.0. Current insulin  regimen includes 50 units of Tresiba  at bedtime and a sliding scale for rapid-acting insulin . Fasting blood sugars range from mid-100s to low 200s. Recent weight loss of 15 pounds noted. Difficulty in maintaining glycemic control due to dietary habits and physical activity related to football season. - Increased Tresiba  to 55 units at bedtime. - Adjust Tresiba  dose based on fasting blood sugars, aiming for 140-150 mg/dL minimum.  - Encouraged daily calls to endocrinology to schedule an appointment. - Called endocrinology office during office visit today and left voicemail. - Scheduled follow-up in 3 months to reassess glycemic control. - Requesting eye exam records today. UACR updated today and WNL. Foot exam normal.

## 2024-09-09 NOTE — Assessment & Plan Note (Signed)
 Counseled patient that after he finishes his weekly doses to transition to an over-the-counter vitamin D3 2000 IU daily supplement to maintain his vitamin D  levels.  Rechecking vitamin D  levels today with labs.

## 2024-09-09 NOTE — Assessment & Plan Note (Signed)
 Cyst ruptured on its own several weeks ago and has healed well. No excision required.

## 2024-09-09 NOTE — Assessment & Plan Note (Signed)
Rechecking TSH today

## 2024-09-09 NOTE — Progress Notes (Signed)
 Established Patient Office Visit  Subjective   Patient ID: Cristian Kennedy, male    DOB: [birth year]  Age: 28 y.o. MRN: [id number]  Chief Complaint  Patient presents with   Medical Management of Chronic Issues    HPI  History of Present Illness    Cristian Kennedy is a 28 year old male with type 1 diabetes who presents for follow-up of his diabetes management.  Glycemic control in type 1 diabetes - A1c increased to 10.7% from 9% at last check - Irregular eating patterns during football season, including late-night meals - Fasting blood glucose ranges from high 100s to low 200s currently  - Current insulin  regimen: 50 units Tresiba  at bedtime, rapid-acting insulin  adjusted based on carbohydrate intake and blood glucose levels - Weight loss of 15 pounds since last visit  Cutaneous cyst - Cyst on back ruptured during football practice, emitting a foul odor  Hypertension management - Currently taking losartan , one pill daily (50 mg)  for blood pressure control. Has been checking at home and reporting good numbers.   Vitamin d  supplementation - Takes high-dose vitamin D  supplement once weekly for hx of vitamin D  deficiency.   Endocrinology follow-up - Attempted to contact endocrinology for appointment, left voicemail and awaiting callback.  - Called office again while patient was in office today         ROS Per HPI.    Objective:     BP 118/79   Pulse 60   Temp 98 F (36.7 C) (Oral)   Ht 6' 3 (1.905 m)   Wt 281 lb (127.5 kg)   SpO2 100%   BMI 35.12 kg/m    Physical Exam Constitutional:      General: He is not in acute distress.    Appearance: Normal appearance.  Cardiovascular:     Rate and Rhythm: Normal rate and regular rhythm.     Heart sounds: Normal heart sounds. No murmur heard.    No friction rub. No gallop.  Pulmonary:     Effort: Pulmonary effort is normal. No respiratory distress.     Breath sounds: Normal breath sounds.  Abdominal:      General: Bowel sounds are normal.  Musculoskeletal:        General: No swelling.     Cervical back: Neck supple.  Lymphadenopathy:     Cervical: No cervical adenopathy.  Skin:    General: Skin is warm and dry.  Neurological:     General: No focal deficit present.     Mental Status: He is alert.  Psychiatric:        Mood and Affect: Mood normal.        Behavior: Behavior normal.        Thought Content: Thought content normal.      Results for orders placed or performed in visit on 09/09/24  POCT HgB A1C  Result Value Ref Range   Hemoglobin A1C 10.7 (A) 4.0 - 5.6 %   HbA1c POC (<> result, manual entry)     HbA1c, POC (prediabetic range)     HbA1c, POC (controlled diabetic range)    POCT UA - Microalbumin  Result Value Ref Range   Microalbumin Ur, POC 30 mg/L   Creatinine, POC 300 mg/dL   Albumin/Creatinine Ratio, Urine, POC <30     Last CBC Lab Results  Component Value Date   WBC 6.4 04/18/2024   HGB 16.2 04/18/2024   HCT 49.9 04/18/2024   MCV 88 04/18/2024  MCH 28.5 04/18/2024   RDW 13.2 04/18/2024   PLT 222 04/18/2024   Last metabolic panel Lab Results  Component Value Date   GLUCOSE 78 04/18/2024   NA 139 04/18/2024   K 4.1 04/18/2024   CL 103 04/18/2024   CO2 22 04/18/2024   BUN 10 04/18/2024   CREATININE 0.93 04/18/2024   EGFR 114 04/18/2024   CALCIUM 9.4 04/18/2024   PROT 7.0 04/18/2024   ALBUMIN 4.5 04/18/2024   LABGLOB 2.5 04/18/2024   AGRATIO 2.0 03/22/2023   BILITOT 0.5 04/18/2024   ALKPHOS 75 04/18/2024   AST 23 04/18/2024   ALT 21 04/18/2024   Last lipids Lab Results  Component Value Date   CHOL 127 04/18/2024   HDL 43 04/18/2024   LDLCALC 63 04/18/2024   TRIG 119 04/18/2024   CHOLHDL 3.0 04/18/2024   Last hemoglobin A1c Lab Results  Component Value Date   HGBA1C 10.7 (A) 09/09/2024   Last thyroid functions Lab Results  Component Value Date   TSH 3.130 04/18/2024   T3TOTAL 137 06/17/2019   FREET4 0.97 03/22/2023   Last  vitamin D  Lab Results  Component Value Date   VD25OH 41.2 04/18/2024      The ASCVD Risk score (Arnett DK, et al., 2019) failed to calculate for the following reasons:   The 2019 ASCVD risk score is only valid for ages 57 to 64    Assessment & Plan:   Type 1 diabetes mellitus with other specified complication (HCC) Assessment & Plan: A1c increased to 10.7 from 9.0. Current insulin  regimen includes 50 units of Tresiba  at bedtime and a sliding scale for rapid-acting insulin . Fasting blood sugars range from mid-100s to low 200s. Recent weight loss of 15 pounds noted. Difficulty in maintaining glycemic control due to dietary habits and physical activity related to football season. - Increased Tresiba  to 55 units at bedtime. - Adjust Tresiba  dose based on fasting blood sugars, aiming for 140-150 mg/dL minimum.  - Encouraged daily calls to endocrinology to schedule an appointment. - Called endocrinology office during office visit today and left voicemail. - Scheduled follow-up in 3 months to reassess glycemic control. - Requesting eye exam records today. UACR updated today and WNL. Foot exam normal.  Orders: -     POCT glycosylated hemoglobin (Hb A1C) -     POCT UA - Microalbumin  Hypertriglyceridemia, familial Assessment & Plan: Last lipid panel: LDL 63, HDL 43, Trig 119. The ASCVD Risk score (Arnett DK, et al., 2019) failed to calculate for the following reasons:   The 2019 ASCVD risk score is only valid for ages 40 to 27 Rechecking lipid panel today with labs. Patient is down about 15 lbs. Discussed that if LDL becomes elevated again in the future, I highly recommend starting statin therapy due to his hx of T1DM.   Orders: -     Lipid panel -     Comprehensive metabolic panel with GFR  Vitamin D  insufficiency Assessment & Plan: Counseled patient that after he finishes his weekly doses to transition to an over-the-counter vitamin D3 2000 IU daily supplement to maintain his vitamin  D levels.  Rechecking vitamin D  levels today with labs.  Orders: -     VITAMIN D  25 Hydroxy (Vit-D Deficiency, Fractures)  Elevated TSH -     TSH  Hypothyroidism, unspecified type Assessment & Plan: Rechecking TSH today.   Epidermoid cyst of skin of back Assessment & Plan: Cyst ruptured on its own several weeks ago and has  healed well. No excision required.    Hypertension associated with diabetes (HCC) Assessment & Plan: BP goal <130/80. Well-controlled with losartan  50 mg. Home readings within target range. Discussed increasing losartan  if readings exceed 130/80. - Continue losartan  50 mg daily. - Monitor blood pressure at home.     Return in about 3 months (around 12/10/2024) for HTN, DM.    Saddie JULIANNA Sacks, PA-C

## 2024-09-10 ENCOUNTER — Other Ambulatory Visit: Payer: Self-pay

## 2024-09-10 ENCOUNTER — Ambulatory Visit: Payer: Self-pay

## 2024-09-10 DIAGNOSIS — E1069 Type 1 diabetes mellitus with other specified complication: Secondary | ICD-10-CM

## 2024-09-10 LAB — COMPREHENSIVE METABOLIC PANEL WITH GFR
ALT: 22 IU/L (ref 0–44)
AST: 20 IU/L (ref 0–40)
Albumin: 4.4 g/dL (ref 4.3–5.2)
Alkaline Phosphatase: 82 IU/L (ref 47–123)
BUN/Creatinine Ratio: 12 (ref 9–20)
BUN: 11 mg/dL (ref 6–20)
Bilirubin Total: 0.6 mg/dL (ref 0.0–1.2)
CO2: 25 mmol/L (ref 20–29)
Calcium: 9.6 mg/dL (ref 8.7–10.2)
Chloride: 99 mmol/L (ref 96–106)
Creatinine, Ser: 0.91 mg/dL (ref 0.76–1.27)
Globulin, Total: 2.5 g/dL (ref 1.5–4.5)
Glucose: 170 mg/dL — ABNORMAL HIGH (ref 70–99)
Potassium: 4.1 mmol/L (ref 3.5–5.2)
Sodium: 137 mmol/L (ref 134–144)
Total Protein: 6.9 g/dL (ref 6.0–8.5)
eGFR: 117 mL/min/1.73 (ref >59)

## 2024-09-10 LAB — LIPID PANEL
Chol/HDL Ratio: 3.3 ratio (ref 0.0–5.0)
Cholesterol, Total: 150 mg/dL (ref 100–199)
HDL: 45 mg/dL (ref >39)
LDL Chol Calc (NIH): 78 mg/dL (ref 0–99)
Triglycerides: 155 mg/dL — ABNORMAL HIGH (ref 0–149)
VLDL Cholesterol Cal: 27 mg/dL (ref 5–40)

## 2024-09-10 LAB — VITAMIN D 25 HYDROXY (VIT D DEFICIENCY, FRACTURES): Vit D, 25-Hydroxy: 28.8 ng/mL — ABNORMAL LOW (ref 30.0–100.0)

## 2024-09-10 LAB — TSH: TSH: 5 u[IU]/mL — ABNORMAL HIGH (ref 0.450–4.500)

## 2024-09-18 ENCOUNTER — Other Ambulatory Visit: Payer: Self-pay | Admitting: Family Medicine

## 2024-09-18 ENCOUNTER — Telehealth: Payer: Self-pay | Admitting: *Deleted

## 2024-09-18 DIAGNOSIS — E1159 Type 2 diabetes mellitus with other circulatory complications: Secondary | ICD-10-CM

## 2024-09-18 NOTE — Telephone Encounter (Signed)
 LVM for pt to call office to give him the below information.     Gayle Saddie FALCON, PA-C  [facility] Please call patient and remind him to call endocrinology and set up his appointment.  We tried calling them at his last OV last week but they did not answer so we left a message. They have been trying to contact him for over a year to get this appointment set up and with his diagnosis of type 1 diabetes, it is imperative that he gets in to see them!  Their phone number is  [phone].  If it's possible to do a 3-way call and call them while he is on the phone, I think this would be the best way to go about this to ensure that the appointment gets scheduled. Not sure if this is an option though.  Thank you!

## 2024-10-02 ENCOUNTER — Other Ambulatory Visit: Payer: Self-pay | Admitting: Family Medicine

## 2024-10-02 DIAGNOSIS — E1069 Type 1 diabetes mellitus with other specified complication: Secondary | ICD-10-CM

## 2024-10-27 ENCOUNTER — Other Ambulatory Visit: Payer: Self-pay

## 2024-10-27 DIAGNOSIS — E1069 Type 1 diabetes mellitus with other specified complication: Secondary | ICD-10-CM

## 2024-12-09 ENCOUNTER — Ambulatory Visit: Admitting: Endocrinology

## 2024-12-15 ENCOUNTER — Ambulatory Visit
